# Patient Record
Sex: Male | Born: 1990 | Race: Black or African American | Hispanic: No | Marital: Single | Smoking: Current every day smoker
Health system: Southern US, Community
[De-identification: ages and names within clinical notes are randomized; demographics above are authoritative.]

---

## 2017-09-06 ENCOUNTER — Encounter (HOSPITAL_COMMUNITY): Payer: Self-pay

## 2017-09-06 ENCOUNTER — Emergency Department (HOSPITAL_COMMUNITY): Payer: No Typology Code available for payment source

## 2017-09-06 ENCOUNTER — Other Ambulatory Visit: Payer: Self-pay

## 2017-09-06 ENCOUNTER — Emergency Department (HOSPITAL_COMMUNITY)
Admission: EM | Admit: 2017-09-06 | Discharge: 2017-09-06 | Disposition: A | Discharge status: Home / Self Care | Payer: No Typology Code available for payment source | Attending: Emergency Medicine | Admitting: Emergency Medicine

## 2017-09-06 DIAGNOSIS — Y9389 Activity, other specified: Secondary | ICD-10-CM | POA: Insufficient documentation

## 2017-09-06 DIAGNOSIS — M25522 Pain in left elbow: Secondary | ICD-10-CM

## 2017-09-06 DIAGNOSIS — Y999 Unspecified external cause status: Secondary | ICD-10-CM | POA: Insufficient documentation

## 2017-09-06 DIAGNOSIS — Y92410 Unspecified street and highway as the place of occurrence of the external cause: Secondary | ICD-10-CM | POA: Insufficient documentation

## 2017-09-06 DIAGNOSIS — M542 Cervicalgia: Secondary | ICD-10-CM | POA: Diagnosis not present

## 2017-09-06 MED ORDER — IBUPROFEN 600 MG PO TABS: 600.0000 mg | ORAL_TABLET | Freq: Four times a day (QID) | ORAL | 0 refills | Status: AC | PRN

## 2017-09-06 MED ORDER — METHOCARBAMOL 500 MG PO TABS: 500.0000 mg | ORAL_TABLET | Freq: Two times a day (BID) | ORAL | 0 refills | Status: AC

## 2017-09-06 NOTE — ED Notes (Signed)
Returned from xray

## 2017-09-06 NOTE — ED Notes (Signed)
Patient transported to X-ray 
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
## 2017-09-27 ENCOUNTER — Ambulatory Visit: Payer: Self-pay | Admitting: Family Medicine

## 2018-02-21 ENCOUNTER — Encounter (HOSPITAL_COMMUNITY): Payer: Self-pay | Admitting: Emergency Medicine

## 2018-02-21 ENCOUNTER — Emergency Department (HOSPITAL_COMMUNITY)
Admission: EM | Admit: 2018-02-21 | Discharge: 2018-02-21 | Disposition: A | Discharge status: Home / Self Care | Payer: Self-pay | Attending: Emergency Medicine | Admitting: Emergency Medicine

## 2018-02-21 ENCOUNTER — Other Ambulatory Visit: Payer: Self-pay

## 2018-02-21 DIAGNOSIS — H1033 Unspecified acute conjunctivitis, bilateral: Secondary | ICD-10-CM | POA: Insufficient documentation

## 2018-02-21 DIAGNOSIS — J069 Acute upper respiratory infection, unspecified: Secondary | ICD-10-CM | POA: Insufficient documentation

## 2018-02-21 DIAGNOSIS — Z79899 Other long term (current) drug therapy: Secondary | ICD-10-CM | POA: Insufficient documentation

## 2018-02-21 LAB — GROUP A STREP BY PCR: GROUP A STREP BY PCR: NOT DETECTED

## 2018-02-21 MED ORDER — AMOXICILLIN 500 MG PO CAPS
1000.0000 mg | ORAL_CAPSULE | Freq: Once | ORAL | Status: AC
  Administered 2018-02-21: 1000 mg via ORAL
  Filled 2018-02-21: qty 2

## 2018-02-21 MED ORDER — AMOXICILLIN 500 MG PO CAPS: 1000.0000 mg | ORAL_CAPSULE | Freq: Two times a day (BID) | ORAL | 0 refills | Status: AC

## 2018-02-21 NOTE — ED Triage Notes (Signed)
Pt reports URI sx that started almost a week ago with runny nose, sore throat when swallowing, a productive cough with yellow phlegm also waking up with eyes crusted shut. Pt has taken otc medications with no relief. Pt states he was laying down trying to go to sleep when he felt like he had a loss of breath, reports he needs to prop himself up to sleep at night.

## 2018-02-21 NOTE — Discharge Instructions (Addendum)
Take the oral antibiotics as prescribed - which will also treat the eye infection. Return here with any worsening symptoms - high fever, severe pain or new concern.

## 2018-02-21 NOTE — ED Notes (Signed)
Reviewed d/c instructions with pt, who verbalized understanding and had no outstanding questions. Pt departed in NAD, refused use of wheelchair.   

## 2018-02-21 NOTE — ED Provider Notes (Signed)
[facility] EMERGENCY DEPARTMENT Provider Note   CSN: [id number] Arrival date & time: 02/21/18  0113     History   Chief Complaint Chief Complaint  Patient presents with  . URI    HPI Randy Perkins is a 27 y.o. male.  Patient without significant medical history presents with symptoms of URI including productive cough, nasal congestion, sore throat, sinus pressure and bilateral eye discharge with morning matting. No headache, fever, wheezing, vomiting or diarrhea. He has been eating and drinking per his usual.   The history is provided by the patient. No language interpreter was used.    History reviewed. No pertinent past medical history.  There are no active problems to display for this patient.   History reviewed. No pertinent surgical history.      Home Medications    Prior to Admission medications   Medication Sig Start Date End Date Taking? Authorizing Provider  ibuprofen (ADVIL,MOTRIN) 600 MG tablet Take 1 tablet (600 mg total) by mouth every 6 (six) hours as needed. 09/06/17   Aviva Kluver B, PA-C  methocarbamol (ROBAXIN) 500 MG tablet Take 1 tablet (500 mg total) by mouth 2 (two) times daily. 09/06/17   Elisha Ponder, PA-C    Family History No family history on file.  Social History Social History   Tobacco Use  . Smoking status: Never Smoker  . Smokeless tobacco: Never Used  Substance Use Topics  . Alcohol use: Yes    Comment: social   . Drug use: Not Currently     Allergies   Patient has no known allergies.   Review of Systems Review of Systems  Constitutional: Negative for chills and fever.  HENT: Positive for rhinorrhea and sore throat.   Respiratory: Positive for cough. Negative for shortness of breath.   Cardiovascular: Positive for chest pain.       Chest pain with cough.  Gastrointestinal: Negative.  Negative for nausea and vomiting.  Musculoskeletal: Negative.   Skin: Negative.   Neurological: Negative.        Physical Exam Updated Vital Signs BP 103/66 (BP Location: Left Arm)   Pulse 61   Temp 97.6 F (36.4 C) (Oral)   Resp 12   Ht 5\' 11"  (1.803 m)   Wt 94.8 kg   SpO2 99%   BMI 29.15 kg/m   Physical Exam  Constitutional: He is oriented to person, place, and time. He appears well-developed and well-nourished. No distress.  HENT:  Head: Normocephalic.  Nose: Mucosal edema present.  Mouth/Throat: No oropharyngeal exudate. No tonsillar exudate.  Eyes:  Purulent conjunctival discharge bilaterally   Cardiovascular: Normal rate and regular rhythm.  Pulmonary/Chest: Effort normal. He has no wheezes.  Abdominal: There is no tenderness.  Neurological: He is alert and oriented to person, place, and time.  Nursing note and vitals reviewed.    ED Treatments / Results  Labs (all labs ordered are listed, but only abnormal results are displayed) Labs Reviewed  GROUP A STREP BY PCR    EKG None  Radiology No results found.  Procedures Procedures (including critical care time)  Medications Ordered in ED Medications - No data to display   Initial Impression / Assessment and Plan / ED Course  I have reviewed the triage vital signs and the nursing notes.  Pertinent labs & imaging results that were available during my care of the patient were reviewed by me and considered in my medical decision making (see chart for details).  Patient to ED with one week symptoms of sore throat, eye discharge, cough, congestion. Symptoms are progressive.   He is nontoxic in appearance. Strep negative. Given duration of symptoms and conjunctival discharge that is purulent, will treat with abx. He is appropriate for discharge home.   Final Clinical Impressions(s) / ED Diagnoses   Final diagnoses:  None   1. Conjunctivitis 2. URI  ED Discharge Orders    None       Elpidio AnisUpstill, Tallan Sandoz, PA-C 02/21/18 0510    Shon BatonHorton, Courtney F, MD 02/21/18 [phone]

## 2019-02-09 ENCOUNTER — Encounter (HOSPITAL_COMMUNITY): Payer: Self-pay | Admitting: Emergency Medicine

## 2019-02-09 ENCOUNTER — Inpatient Hospital Stay (HOSPITAL_COMMUNITY): Payer: No Typology Code available for payment source

## 2019-02-09 ENCOUNTER — Emergency Department (HOSPITAL_COMMUNITY): Payer: No Typology Code available for payment source

## 2019-02-09 ENCOUNTER — Inpatient Hospital Stay (HOSPITAL_COMMUNITY)
Admission: EM | Admit: 2019-02-09 | Discharge: 2019-02-12 | DRG: 957 | Disposition: A | Discharge status: Home / Self Care | Payer: No Typology Code available for payment source | Attending: Surgery | Admitting: Surgery

## 2019-02-09 ENCOUNTER — Other Ambulatory Visit: Payer: Self-pay

## 2019-02-09 DIAGNOSIS — D62 Acute posthemorrhagic anemia: Secondary | ICD-10-CM | POA: Diagnosis not present

## 2019-02-09 DIAGNOSIS — F10129 Alcohol abuse with intoxication, unspecified: Secondary | ICD-10-CM | POA: Diagnosis present

## 2019-02-09 DIAGNOSIS — F17210 Nicotine dependence, cigarettes, uncomplicated: Secondary | ICD-10-CM | POA: Diagnosis present

## 2019-02-09 DIAGNOSIS — E876 Hypokalemia: Secondary | ICD-10-CM | POA: Diagnosis present

## 2019-02-09 DIAGNOSIS — S32409A Unspecified fracture of unspecified acetabulum, initial encounter for closed fracture: Secondary | ICD-10-CM

## 2019-02-09 DIAGNOSIS — T796XXA Traumatic ischemia of muscle, initial encounter: Secondary | ICD-10-CM | POA: Diagnosis present

## 2019-02-09 DIAGNOSIS — S32402A Unspecified fracture of left acetabulum, initial encounter for closed fracture: Secondary | ICD-10-CM | POA: Diagnosis present

## 2019-02-09 DIAGNOSIS — E8889 Other specified metabolic disorders: Secondary | ICD-10-CM | POA: Diagnosis present

## 2019-02-09 DIAGNOSIS — S332XXA Dislocation of sacroiliac and sacrococcygeal joint, initial encounter: Secondary | ICD-10-CM | POA: Diagnosis present

## 2019-02-09 DIAGNOSIS — L299 Pruritus, unspecified: Secondary | ICD-10-CM | POA: Diagnosis not present

## 2019-02-09 DIAGNOSIS — S32452A Displaced transverse fracture of left acetabulum, initial encounter for closed fracture: Principal | ICD-10-CM | POA: Diagnosis present

## 2019-02-09 DIAGNOSIS — N5089 Other specified disorders of the male genital organs: Secondary | ICD-10-CM | POA: Diagnosis not present

## 2019-02-09 DIAGNOSIS — M79605 Pain in left leg: Secondary | ICD-10-CM

## 2019-02-09 DIAGNOSIS — Y92410 Unspecified street and highway as the place of occurrence of the external cause: Secondary | ICD-10-CM

## 2019-02-09 DIAGNOSIS — R339 Retention of urine, unspecified: Secondary | ICD-10-CM | POA: Diagnosis not present

## 2019-02-09 DIAGNOSIS — U071 COVID-19: Secondary | ICD-10-CM | POA: Diagnosis present

## 2019-02-09 DIAGNOSIS — J939 Pneumothorax, unspecified: Secondary | ICD-10-CM

## 2019-02-09 DIAGNOSIS — S32810A Multiple fractures of pelvis with stable disruption of pelvic ring, initial encounter for closed fracture: Secondary | ICD-10-CM | POA: Diagnosis present

## 2019-02-09 DIAGNOSIS — E559 Vitamin D deficiency, unspecified: Secondary | ICD-10-CM | POA: Diagnosis present

## 2019-02-09 DIAGNOSIS — S32692A Other specified fracture of left ischium, initial encounter for closed fracture: Secondary | ICD-10-CM

## 2019-02-09 DIAGNOSIS — I10 Essential (primary) hypertension: Secondary | ICD-10-CM | POA: Diagnosis not present

## 2019-02-09 DIAGNOSIS — S32492A Other specified fracture of left acetabulum, initial encounter for closed fracture: Secondary | ICD-10-CM

## 2019-02-09 DIAGNOSIS — S329XXA Fracture of unspecified parts of lumbosacral spine and pelvis, initial encounter for closed fracture: Secondary | ICD-10-CM

## 2019-02-09 LAB — COMPREHENSIVE METABOLIC PANEL
ALT: 30 U/L (ref 0–44)
AST: 32 U/L (ref 15–41)
Albumin: 4.1 g/dL (ref 3.5–5.0)
Alkaline Phosphatase: 38 U/L (ref 38–126)
Anion gap: 14 (ref 5–15)
BUN: 10 mg/dL (ref 6–20)
CO2: 24 mmol/L (ref 22–32)
Calcium: 8.8 mg/dL — ABNORMAL LOW (ref 8.9–10.3)
Chloride: 100 mmol/L (ref 98–111)
Creatinine, Ser: 1.12 mg/dL (ref 0.61–1.24)
GFR calc Af Amer: >60 mL/min (ref >60)
GFR calc non Af Amer: >60 mL/min (ref >60)
Glucose, Bld: 124 mg/dL — ABNORMAL HIGH (ref 70–99)
Potassium: 3.4 mmol/L — ABNORMAL LOW (ref 3.5–5.1)
Sodium: 138 mmol/L (ref 135–145)
Total Bilirubin: 0.4 mg/dL (ref 0.3–1.2)
Total Protein: 7 g/dL (ref 6.5–8.1)

## 2019-02-09 LAB — CBC
HCT: 44.5 % (ref 39.0–52.0)
HCT: 43.2 % (ref 39.0–52.0)
Hemoglobin: 15.4 g/dL (ref 13.0–17.0)
Hemoglobin: 15.2 g/dL (ref 13.0–17.0)
MCH: 30.6 pg (ref 26.0–34.0)
MCH: 30.6 pg (ref 26.0–34.0)
MCHC: 34.6 g/dL (ref 30.0–36.0)
MCHC: 35.2 g/dL (ref 30.0–36.0)
MCV: 88.3 fL (ref 80.0–100.0)
MCV: 87.1 fL (ref 80.0–100.0)
Platelets: 302 10*3/uL (ref 150–400)
Platelets: 266 10*3/uL (ref 150–400)
RBC: 5.04 MIL/uL (ref 4.22–5.81)
RBC: 4.96 MIL/uL (ref 4.22–5.81)
RDW: 12.1 % (ref 11.5–15.5)
RDW: 12.2 % (ref 11.5–15.5)
WBC: 7.9 10*3/uL (ref 4.0–10.5)
WBC: 13 10*3/uL — ABNORMAL HIGH (ref 4.0–10.5)
nRBC: 0.3 % — ABNORMAL HIGH (ref 0.0–0.2)
nRBC: 0 % (ref 0.0–0.2)

## 2019-02-09 LAB — URINALYSIS, ROUTINE W REFLEX MICROSCOPIC
Bilirubin Urine: NEGATIVE
Glucose, UA: NEGATIVE mg/dL
Hgb urine dipstick: NEGATIVE
Ketones, ur: 5 mg/dL — AB
Leukocytes,Ua: NEGATIVE
Nitrite: NEGATIVE
Protein, ur: NEGATIVE mg/dL
Specific Gravity, Urine: >1.046 — ABNORMAL HIGH (ref 1.005–1.030)
pH: 5 (ref 5.0–8.0)

## 2019-02-09 LAB — RAPID URINE DRUG SCREEN, HOSP PERFORMED
Amphetamines: NOT DETECTED
Barbiturates: NOT DETECTED
Benzodiazepines: NOT DETECTED
Cocaine: NOT DETECTED
Opiates: POSITIVE — AB
Tetrahydrocannabinol: NOT DETECTED

## 2019-02-09 LAB — FERRITIN: Ferritin: 191 ng/mL (ref 24–336)

## 2019-02-09 LAB — I-STAT CHEM 8, ED
BUN: 10 mg/dL (ref 6–20)
Calcium, Ion: 1.11 mmol/L — ABNORMAL LOW (ref 1.15–1.40)
Chloride: 101 mmol/L (ref 98–111)
Creatinine, Ser: 1.3 mg/dL — ABNORMAL HIGH (ref 0.61–1.24)
Glucose, Bld: 120 mg/dL — ABNORMAL HIGH (ref 70–99)
HCT: 45 % (ref 39.0–52.0)
Hemoglobin: 15.3 g/dL (ref 13.0–17.0)
Potassium: 3.3 mmol/L — ABNORMAL LOW (ref 3.5–5.1)
Sodium: 139 mmol/L (ref 135–145)
TCO2: 24 mmol/L (ref 22–32)

## 2019-02-09 LAB — D-DIMER, QUANTITATIVE: D-Dimer, Quant: 1.81 ug/mL-FEU — ABNORMAL HIGH (ref 0.00–0.50)

## 2019-02-09 LAB — CREATININE, SERUM
Creatinine, Ser: 1.04 mg/dL (ref 0.61–1.24)
GFR calc Af Amer: >60 mL/min (ref >60)
GFR calc non Af Amer: >60 mL/min (ref >60)

## 2019-02-09 LAB — ABO/RH: ABO/RH(D): O POS

## 2019-02-09 LAB — CDS SEROLOGY

## 2019-02-09 LAB — ETHANOL: Alcohol, Ethyl (B): 212 mg/dL — ABNORMAL HIGH (ref <10)

## 2019-02-09 LAB — SAMPLE TO BLOOD BANK

## 2019-02-09 LAB — HIV ANTIBODY (ROUTINE TESTING W REFLEX): HIV Screen 4th Generation wRfx: NONREACTIVE

## 2019-02-09 LAB — SARS CORONAVIRUS 2 BY RT PCR (HOSPITAL ORDER, PERFORMED IN ~~LOC~~ HOSPITAL LAB): SARS Coronavirus 2: POSITIVE — AB

## 2019-02-09 LAB — PROTIME-INR
INR: 1 (ref 0.8–1.2)
Prothrombin Time: 13.2 seconds (ref 11.4–15.2)

## 2019-02-09 LAB — C-REACTIVE PROTEIN: CRP: 1.2 mg/dL — ABNORMAL HIGH (ref <1.0)

## 2019-02-09 LAB — LACTATE DEHYDROGENASE: LDH: 271 U/L — ABNORMAL HIGH (ref 98–192)

## 2019-02-09 LAB — LACTIC ACID, PLASMA
Lactic Acid, Venous: 3.7 mmol/L (ref 0.5–1.9)
Lactic Acid, Venous: 1.7 mmol/L (ref 0.5–1.9)

## 2019-02-09 LAB — PROCALCITONIN: Procalcitonin: <0.1 ng/mL

## 2019-02-09 LAB — FIBRINOGEN: Fibrinogen: 334 mg/dL (ref 210–475)

## 2019-02-09 LAB — BRAIN NATRIURETIC PEPTIDE: B Natriuretic Peptide: 99.1 pg/mL (ref 0.0–100.0)

## 2019-02-09 LAB — HEPATITIS B SURFACE ANTIGEN: Hepatitis B Surface Ag: NONREACTIVE

## 2019-02-09 MED ORDER — CALCIUM GLUCONATE-NACL 1-0.675 GM/50ML-% IV SOLN
1.0000 g | Freq: Once | INTRAVENOUS | Status: AC
  Administered 2019-02-09: 1000 mg via INTRAVENOUS
  Filled 2019-02-09: qty 50

## 2019-02-09 MED ORDER — HYDROMORPHONE HCL 1 MG/ML IJ SOLN
1.0000 mg | INTRAMUSCULAR | Status: DC | PRN
  Administered 2019-02-09 – 2019-02-10 (×8): 1 mg via INTRAVENOUS
  Filled 2019-02-09 (×7): qty 1

## 2019-02-09 MED ORDER — LIDOCAINE HCL (PF) 1 % IJ SOLN
30.0000 mL | Freq: Once | INTRAMUSCULAR | Status: AC
  Administered 2019-02-09: 30 mL via INTRADERMAL
  Filled 2019-02-09: qty 30

## 2019-02-09 MED ORDER — ONDANSETRON HCL 4 MG/2ML IJ SOLN
INTRAMUSCULAR | Status: AC
  Filled 2019-02-09: qty 2

## 2019-02-09 MED ORDER — ONDANSETRON HCL 4 MG/2ML IJ SOLN: 4.0000 mg | Freq: Four times a day (QID) | INTRAMUSCULAR | Status: DC | PRN

## 2019-02-09 MED ORDER — ENOXAPARIN SODIUM 40 MG/0.4ML ~~LOC~~ SOLN
40.0000 mg | SUBCUTANEOUS | Status: DC
  Administered 2019-02-11 – 2019-02-12 (×2): 40 mg via SUBCUTANEOUS
  Filled 2019-02-09 (×2): qty 0.4

## 2019-02-09 MED ORDER — CEFAZOLIN SODIUM-DEXTROSE 2-4 GM/100ML-% IV SOLN
2.0000 g | INTRAVENOUS | Status: AC
  Administered 2019-02-10: 2 g via INTRAVENOUS
  Filled 2019-02-09: qty 100

## 2019-02-09 MED ORDER — MORPHINE SULFATE 2 MG/ML IJ SOLN
INTRAMUSCULAR | Status: AC | PRN
  Administered 2019-02-09: 4 mg via INTRAVENOUS

## 2019-02-09 MED ORDER — KCL IN DEXTROSE-NACL 20-5-0.9 MEQ/L-%-% IV SOLN
INTRAVENOUS | Status: DC
  Administered 2019-02-09: 20:00:00 via INTRAVENOUS
  Administered 2019-02-09: 100 mL/h via INTRAVENOUS
  Administered 2019-02-10: 19:00:00 via INTRAVENOUS
  Filled 2019-02-09 (×4): qty 1000

## 2019-02-09 MED ORDER — HYDROMORPHONE HCL 1 MG/ML IJ SOLN
INTRAMUSCULAR | Status: AC
  Filled 2019-02-09: qty 1

## 2019-02-09 MED ORDER — VITAMIN C 500 MG PO TABS
500.0000 mg | ORAL_TABLET | Freq: Every day | ORAL | Status: DC
  Administered 2019-02-11 – 2019-02-12 (×2): 500 mg via ORAL
  Filled 2019-02-09 (×2): qty 1

## 2019-02-09 MED ORDER — PANTOPRAZOLE SODIUM 40 MG PO TBEC
40.0000 mg | DELAYED_RELEASE_TABLET | Freq: Every day | ORAL | Status: DC
  Administered 2019-02-11: 40 mg via ORAL
  Filled 2019-02-09: qty 1

## 2019-02-09 MED ORDER — ALBUTEROL SULFATE HFA 108 (90 BASE) MCG/ACT IN AERS
2.0000 | INHALATION_SPRAY | Freq: Four times a day (QID) | RESPIRATORY_TRACT | Status: DC | PRN
  Filled 2019-02-09: qty 6.7

## 2019-02-09 MED ORDER — HYDROMORPHONE HCL 1 MG/ML IJ SOLN
INTRAMUSCULAR | Status: AC | PRN
  Administered 2019-02-09: 1 mg via INTRAVENOUS

## 2019-02-09 MED ORDER — MORPHINE SULFATE (PF) 4 MG/ML IV SOLN
INTRAVENOUS | Status: AC
  Filled 2019-02-09: qty 1

## 2019-02-09 MED ORDER — ZINC SULFATE 220 (50 ZN) MG PO CAPS
220.0000 mg | ORAL_CAPSULE | Freq: Every day | ORAL | Status: DC
  Administered 2019-02-11 – 2019-02-12 (×2): 220 mg via ORAL
  Filled 2019-02-09 (×3): qty 1

## 2019-02-09 MED ORDER — ONDANSETRON HCL 4 MG/2ML IJ SOLN
INTRAMUSCULAR | Status: AC | PRN
  Administered 2019-02-09: 4 mg via INTRAVENOUS

## 2019-02-09 MED ORDER — ONDANSETRON 4 MG PO TBDP: 4.0000 mg | ORAL_TABLET | Freq: Four times a day (QID) | ORAL | Status: DC | PRN

## 2019-02-09 MED ORDER — IOHEXOL 300 MG/ML  SOLN
100.0000 mL | Freq: Once | INTRAMUSCULAR | Status: AC | PRN
  Administered 2019-02-09: 100 mL via INTRAVENOUS

## 2019-02-09 MED ORDER — LIDOCAINE HCL 1 % IJ SOLN: 20.0000 mL | INTRAMUSCULAR | Status: DC

## 2019-02-09 MED ORDER — GUAIFENESIN-DM 100-10 MG/5ML PO SYRP: 10.0000 mL | ORAL_SOLUTION | ORAL | Status: DC | PRN
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
## 2019-02-09 NOTE — Progress Notes (Signed)
Orthopedic Tech Progress Note Patient Details:  Randy Perkins [birth year] [id number]  Musculoskeletal Traction Type of Traction: Bucks Skin Traction Traction Location: lle Traction Weight: 10 lbs   Post Interventions Patient Tolerated: Well Instructions Provided: Care of device, Adjustment of device   Karolee Stamps 02/09/2019, 6:19 AM

## 2019-02-09 NOTE — ED Notes (Signed)
Pt resting quietly at this time  Call light at bedside
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
## 2019-02-09 NOTE — ED Notes (Signed)
Port pelvis x-ray obtained

## 2019-02-09 NOTE — ED Notes (Signed)
Dinner tray ordered for pt

## 2019-02-09 NOTE — Procedures (Signed)
Clinicians: Altamese Lapwai, MD        Ainsley Spinner, PA-C  Procedure: Closed reduction left acetabulum and left pelvic ring, placement of left proximal tibia skeletal traction pin  Medications: 20 cc of 1% lidocaine without epinephrine (10 cc medially and 10 cc laterally)  Details:  Injury and treatment were reviewed with the patient.  skeletal traction placement necessary to further limit the injury to his joint surfaces on both his femoral head as well as acetabulum and improve hemodynamics. It would also provide him some stability while we await definitive fixation. Patient is agreeable to proceed with traction pin placement.  Clinical exam was completed.  An area approximately on thumbs breadth distal and posterior to the tibial tuberosity was identified laterally for starting point of the k-wire.  Soft tissue was infiltrated with lidocaine as noted above.   A 2.0 mm K wire was selected. After adequate anesthesia of the soft tissues the K wire was introduced through the soft tissue laterally along the proximal tibia. The pin was inserted and placed down to the bone. Once I was comfortable with the starting point the pin was advanced utilizing power drill through the lateral proximal tibia and out the medial proximal tibia. Trying to remain parallel to the joint. Pt tolerated the procedure well.    After the pin was advanced through and equal lengths of the pin were noted medially and laterally the tension bow was applied and the ends of the wire were bent up and covered with tape. 30 pounds of weight were added. The leg was set up in an abducted position along with blankets underneath the knee and thigh. Patient had relief of his hip pain with application of traction  Again patient tolerated the procedure extremely well. No complications were noted. Symmetric pulses were noted post procedure. Motor and sensory exam was intact and unchanged from baseline. Plan for OR tomorrow for ORIF left acetabulum and  left pelvic ring  Of note patient was placed on hospital bed equipped for traction prior to starting the procedure   Jari Pigg, PA-C [phone] (C) 02/09/2019, 7:05 PM  [facility] [place] [address] [phone] Domingo Sep (F)

## 2019-02-09 NOTE — ED Notes (Signed)
Ordered a orthopedic hospital bed per rn Yasemia--Tishie Altmann

## 2019-02-09 NOTE — ED Notes (Signed)
Condom cath removed at pt's request
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
## 2019-02-09 NOTE — ED Notes (Signed)
1934.00 locked up in security envelope #[id number] [phone]
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
## 2019-02-09 NOTE — ED Notes (Signed)
Ortho at bedside.

## 2019-02-09 NOTE — ED Notes (Signed)
Pt wife name Andria Frames would like an update on the pt status. Please call [phone].

## 2019-02-09 NOTE — ED Notes (Signed)
Patient wife Randy Perkins called and update given.
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
## 2019-02-10 ENCOUNTER — Inpatient Hospital Stay (HOSPITAL_COMMUNITY): Payer: No Typology Code available for payment source | Admitting: Certified Registered Nurse Anesthetist

## 2019-02-10 ENCOUNTER — Encounter (HOSPITAL_COMMUNITY): Admission: EM | Disposition: A | Discharge status: Home / Self Care | Payer: Self-pay | Source: Home / Self Care

## 2019-02-10 ENCOUNTER — Inpatient Hospital Stay (HOSPITAL_COMMUNITY): Payer: No Typology Code available for payment source

## 2019-02-10 DIAGNOSIS — S32402A Unspecified fracture of left acetabulum, initial encounter for closed fracture: Secondary | ICD-10-CM

## 2019-02-10 HISTORY — PX: ORIF ACETABULAR FRACTURE: SHX5029

## 2019-02-10 LAB — COMPREHENSIVE METABOLIC PANEL
ALT: 30 U/L (ref 0–44)
AST: 41 U/L (ref 15–41)
Albumin: 4 g/dL (ref 3.5–5.0)
Alkaline Phosphatase: 40 U/L (ref 38–126)
Anion gap: 14 (ref 5–15)
BUN: 13 mg/dL (ref 6–20)
CO2: 26 mmol/L (ref 22–32)
Calcium: 9.4 mg/dL (ref 8.9–10.3)
Chloride: 98 mmol/L (ref 98–111)
Creatinine, Ser: 1.21 mg/dL (ref 0.61–1.24)
GFR calc Af Amer: >60 mL/min (ref >60)
GFR calc non Af Amer: >60 mL/min (ref >60)
Glucose, Bld: 129 mg/dL — ABNORMAL HIGH (ref 70–99)
Potassium: 3.5 mmol/L (ref 3.5–5.1)
Sodium: 138 mmol/L (ref 135–145)
Total Bilirubin: 1.4 mg/dL — ABNORMAL HIGH (ref 0.3–1.2)
Total Protein: 7.3 g/dL (ref 6.5–8.1)

## 2019-02-10 LAB — CBC WITH DIFFERENTIAL/PLATELET
Abs Immature Granulocytes: 0.04 10*3/uL (ref 0.00–0.07)
Basophils Absolute: 0 10*3/uL (ref 0.0–0.1)
Basophils Relative: 0 %
Eosinophils Absolute: 0 10*3/uL (ref 0.0–0.5)
Eosinophils Relative: 0 %
HCT: 45.6 % (ref 39.0–52.0)
Hemoglobin: 15.5 g/dL (ref 13.0–17.0)
Immature Granulocytes: 1 %
Lymphocytes Relative: 21 %
Lymphs Abs: 1.8 10*3/uL (ref 0.7–4.0)
MCH: 30.6 pg (ref 26.0–34.0)
MCHC: 34 g/dL (ref 30.0–36.0)
MCV: 89.9 fL (ref 80.0–100.0)
Monocytes Absolute: 1 10*3/uL (ref 0.1–1.0)
Monocytes Relative: 12 %
Neutro Abs: 5.5 10*3/uL (ref 1.7–7.7)
Neutrophils Relative %: 66 %
Platelets: 277 10*3/uL (ref 150–400)
RBC: 5.07 MIL/uL (ref 4.22–5.81)
RDW: 12.5 % (ref 11.5–15.5)
WBC: 8.4 10*3/uL (ref 4.0–10.5)
nRBC: 0 % (ref 0.0–0.2)

## 2019-02-10 LAB — POCT I-STAT, CHEM 8
BUN: 13 mg/dL (ref 6–20)
BUN: 12 mg/dL (ref 6–20)
Calcium, Ion: 1.2 mmol/L (ref 1.15–1.40)
Calcium, Ion: 1.17 mmol/L (ref 1.15–1.40)
Chloride: 100 mmol/L (ref 98–111)
Chloride: 99 mmol/L (ref 98–111)
Creatinine, Ser: 1 mg/dL (ref 0.61–1.24)
Creatinine, Ser: 0.9 mg/dL (ref 0.61–1.24)
Glucose, Bld: 103 mg/dL — ABNORMAL HIGH (ref 70–99)
Glucose, Bld: 126 mg/dL — ABNORMAL HIGH (ref 70–99)
HCT: 39 % (ref 39.0–52.0)
HCT: 35 % — ABNORMAL LOW (ref 39.0–52.0)
Hemoglobin: 13.3 g/dL (ref 13.0–17.0)
Hemoglobin: 11.9 g/dL — ABNORMAL LOW (ref 13.0–17.0)
Potassium: 4 mmol/L (ref 3.5–5.1)
Potassium: 4.1 mmol/L (ref 3.5–5.1)
Sodium: 137 mmol/L (ref 135–145)
Sodium: 137 mmol/L (ref 135–145)
TCO2: 26 mmol/L (ref 22–32)
TCO2: 24 mmol/L (ref 22–32)

## 2019-02-10 LAB — VITAMIN D 25 HYDROXY (VIT D DEFICIENCY, FRACTURES): Vit D, 25-Hydroxy: 10.58 ng/mL — ABNORMAL LOW (ref 30–100)

## 2019-02-10 LAB — C-REACTIVE PROTEIN: CRP: 10.4 mg/dL — ABNORMAL HIGH (ref <1.0)

## 2019-02-10 LAB — PHOSPHORUS: Phosphorus: 3.3 mg/dL (ref 2.5–4.6)

## 2019-02-10 LAB — MAGNESIUM: Magnesium: 1.9 mg/dL (ref 1.7–2.4)

## 2019-02-10 LAB — FERRITIN: Ferritin: 202 ng/mL (ref 24–336)

## 2019-02-10 LAB — D-DIMER, QUANTITATIVE: D-Dimer, Quant: 1.33 ug/mL-FEU — ABNORMAL HIGH (ref 0.00–0.50)

## 2019-02-10 SURGERY — OPEN REDUCTION INTERNAL FIXATION (ORIF) ACETABULAR FRACTURE
Anesthesia: General | Site: Pelvis | Laterality: Left

## 2019-02-10 MED ORDER — OXYCODONE HCL 5 MG PO TABS
10.0000 mg | ORAL_TABLET | ORAL | Status: DC | PRN
  Administered 2019-02-10 – 2019-02-11 (×5): 15 mg via ORAL
  Administered 2019-02-12 (×3): 10 mg via ORAL
  Filled 2019-02-10 (×4): qty 3
  Filled 2019-02-10 (×2): qty 2
  Filled 2019-02-10: qty 3
  Filled 2019-02-10: qty 2

## 2019-02-10 MED ORDER — ACETAMINOPHEN 10 MG/ML IV SOLN
INTRAVENOUS | Status: DC | PRN
  Administered 2019-02-10: 1000 mg via INTRAVENOUS

## 2019-02-10 MED ORDER — FENTANYL CITRATE (PF) 100 MCG/2ML IJ SOLN: 25.0000 ug | INTRAMUSCULAR | Status: DC | PRN

## 2019-02-10 MED ORDER — FENTANYL CITRATE (PF) 100 MCG/2ML IJ SOLN
25.0000 ug | INTRAMUSCULAR | Status: DC | PRN
  Administered 2019-02-10 (×2): 50 ug via INTRAVENOUS

## 2019-02-10 MED ORDER — MIDAZOLAM HCL 5 MG/5ML IJ SOLN
INTRAMUSCULAR | Status: DC | PRN
  Administered 2019-02-10: 2 mg via INTRAVENOUS

## 2019-02-10 MED ORDER — 0.9 % SODIUM CHLORIDE (POUR BTL) OPTIME
TOPICAL | Status: DC | PRN
  Administered 2019-02-10: 1000 mL

## 2019-02-10 MED ORDER — ONDANSETRON HCL 4 MG/2ML IJ SOLN: 4.0000 mg | Freq: Once | INTRAMUSCULAR | Status: DC | PRN

## 2019-02-10 MED ORDER — OXYCODONE HCL 5 MG PO TABS
5.0000 mg | ORAL_TABLET | Freq: Once | ORAL | Status: AC | PRN
  Administered 2019-02-10: 5 mg via ORAL

## 2019-02-10 MED ORDER — HYDROMORPHONE HCL 1 MG/ML IJ SOLN
INTRAMUSCULAR | Status: DC | PRN
  Administered 2019-02-10: 1 mg via INTRAVENOUS

## 2019-02-10 MED ORDER — PROPOFOL 10 MG/ML IV BOLUS
INTRAVENOUS | Status: DC | PRN
  Administered 2019-02-10: 200 mg via INTRAVENOUS

## 2019-02-10 MED ORDER — CHLORHEXIDINE GLUCONATE CLOTH 2 % EX PADS
6.0000 | MEDICATED_PAD | Freq: Every day | CUTANEOUS | Status: DC
  Administered 2019-02-10 – 2019-02-12 (×3): 6 via TOPICAL

## 2019-02-10 MED ORDER — ONDANSETRON HCL 4 MG/2ML IJ SOLN
INTRAMUSCULAR | Status: DC | PRN
  Administered 2019-02-10: 4 mg via INTRAVENOUS

## 2019-02-10 MED ORDER — SUGAMMADEX SODIUM 200 MG/2ML IV SOLN
INTRAVENOUS | Status: DC | PRN
  Administered 2019-02-10: 400 mg via INTRAVENOUS

## 2019-02-10 MED ORDER — PROPOFOL 10 MG/ML IV BOLUS
INTRAVENOUS | Status: AC
  Filled 2019-02-10: qty 40

## 2019-02-10 MED ORDER — LIDOCAINE 2% (20 MG/ML) 5 ML SYRINGE
INTRAMUSCULAR | Status: DC | PRN
  Administered 2019-02-10: 60 mg via INTRAVENOUS

## 2019-02-10 MED ORDER — ALBUMIN HUMAN 5 % IV SOLN
INTRAVENOUS | Status: DC | PRN
  Administered 2019-02-10: 09:00:00 via INTRAVENOUS

## 2019-02-10 MED ORDER — SUCCINYLCHOLINE CHLORIDE 20 MG/ML IJ SOLN
INTRAMUSCULAR | Status: DC | PRN
  Administered 2019-02-10: 120 mg via INTRAVENOUS

## 2019-02-10 MED ORDER — METHOCARBAMOL 750 MG PO TABS
750.0000 mg | ORAL_TABLET | Freq: Three times a day (TID) | ORAL | Status: DC
  Administered 2019-02-10 – 2019-02-11 (×2): 750 mg via ORAL
  Filled 2019-02-10 (×6): qty 1

## 2019-02-10 MED ORDER — OXYCODONE HCL 5 MG/5ML PO SOLN: 5.0000 mg | Freq: Once | ORAL | Status: AC | PRN

## 2019-02-10 MED ORDER — FENTANYL CITRATE (PF) 250 MCG/5ML IJ SOLN
INTRAMUSCULAR | Status: AC
  Filled 2019-02-10: qty 5

## 2019-02-10 MED ORDER — SODIUM CHLORIDE 0.9 % IR SOLN
Status: DC | PRN
  Administered 2019-02-10: 3000 mL

## 2019-02-10 MED ORDER — HEMOSTATIC AGENTS (NO CHARGE) OPTIME
TOPICAL | Status: DC | PRN
  Administered 2019-02-10: 1 via TOPICAL

## 2019-02-10 MED ORDER — OXYCODONE HCL 5 MG/5ML PO SOLN: 5.0000 mg | Freq: Once | ORAL | Status: DC | PRN

## 2019-02-10 MED ORDER — HYDROMORPHONE HCL 1 MG/ML IJ SOLN
INTRAMUSCULAR | Status: AC
  Filled 2019-02-10: qty 0.5

## 2019-02-10 MED ORDER — FENTANYL CITRATE (PF) 100 MCG/2ML IJ SOLN
INTRAMUSCULAR | Status: DC | PRN
  Administered 2019-02-10: 50 ug via INTRAVENOUS
  Administered 2019-02-10: 150 ug via INTRAVENOUS
  Administered 2019-02-10 (×6): 50 ug via INTRAVENOUS

## 2019-02-10 MED ORDER — ACETAMINOPHEN 500 MG PO TABS
1000.0000 mg | ORAL_TABLET | Freq: Three times a day (TID) | ORAL | Status: DC
  Administered 2019-02-10 – 2019-02-11 (×3): 1000 mg via ORAL
  Filled 2019-02-10 (×3): qty 2

## 2019-02-10 MED ORDER — VITAMIN D 25 MCG (1000 UNIT) PO TABS
2000.0000 [IU] | ORAL_TABLET | Freq: Two times a day (BID) | ORAL | Status: DC
  Administered 2019-02-11 – 2019-02-12 (×3): 2000 [IU] via ORAL
  Filled 2019-02-10 (×4): qty 2

## 2019-02-10 MED ORDER — MIDAZOLAM HCL 2 MG/2ML IJ SOLN
INTRAMUSCULAR | Status: AC
  Filled 2019-02-10: qty 2

## 2019-02-10 MED ORDER — ROCURONIUM BROMIDE 50 MG/5ML IV SOSY
PREFILLED_SYRINGE | INTRAVENOUS | Status: DC | PRN
  Administered 2019-02-10: 30 mg via INTRAVENOUS
  Administered 2019-02-10: 70 mg via INTRAVENOUS
  Administered 2019-02-10: 20 mg via INTRAVENOUS

## 2019-02-10 MED ORDER — FENTANYL CITRATE (PF) 100 MCG/2ML IJ SOLN
INTRAMUSCULAR | Status: AC
  Filled 2019-02-10: qty 2

## 2019-02-10 MED ORDER — OXYCODONE HCL 5 MG PO TABS
ORAL_TABLET | ORAL | Status: AC
  Filled 2019-02-10: qty 1

## 2019-02-10 MED ORDER — LACTATED RINGERS IV SOLN
INTRAVENOUS | Status: DC | PRN
  Administered 2019-02-10 (×3): via INTRAVENOUS

## 2019-02-10 MED ORDER — CEFAZOLIN SODIUM-DEXTROSE 1-4 GM/50ML-% IV SOLN
1.0000 g | Freq: Three times a day (TID) | INTRAVENOUS | Status: AC
  Administered 2019-02-10 – 2019-02-11 (×3): 1 g via INTRAVENOUS
  Filled 2019-02-10 (×3): qty 50

## 2019-02-10 MED ORDER — DEXAMETHASONE SODIUM PHOSPHATE 10 MG/ML IJ SOLN
INTRAMUSCULAR | Status: DC | PRN
  Administered 2019-02-10: 10 mg via INTRAVENOUS

## 2019-02-10 MED ORDER — CEFAZOLIN SODIUM-DEXTROSE 2-3 GM-%(50ML) IV SOLR
INTRAVENOUS | Status: DC | PRN
  Administered 2019-02-10: 2 g via INTRAVENOUS

## 2019-02-10 MED ORDER — OXYCODONE HCL 5 MG PO TABS: 5.0000 mg | ORAL_TABLET | Freq: Once | ORAL | Status: DC | PRN

## 2019-02-10 SURGICAL SUPPLY — 86 items
APPLIER CLIP 11 MED OPEN (CLIP) ×3
BIT DRILL 5.6 (BIT) ×1 IMPLANT
BIT DRILL AO MATTA 2.5MX230M (BIT) ×1 IMPLANT
BIT DRILL STEP 3.5 (DRILL) IMPLANT
BIT DRILL TWST MATTA 3.5MX195M (BIT) ×1 IMPLANT
BLADE CLIPPER SURG (BLADE) IMPLANT
BRUSH SCRUB EZ PLAIN DRY (MISCELLANEOUS) ×6 IMPLANT
CLIP APPLIE 11 MED OPEN (CLIP) ×1 IMPLANT
CLOSURE WOUND 1/2 X4 (GAUZE/BANDAGES/DRESSINGS) ×1
COVER SURGICAL LIGHT HANDLE (MISCELLANEOUS) ×3 IMPLANT
COVER WAND RF STERILE (DRAPES) ×3 IMPLANT
DRAPE C-ARM 42X72 X-RAY (DRAPES) ×3 IMPLANT
DRAPE C-ARMOR (DRAPES) ×3 IMPLANT
DRAPE INCISE IOBAN 66X45 STRL (DRAPES) ×3 IMPLANT
DRAPE INCISE IOBAN 85X60 (DRAPES) ×3 IMPLANT
DRAPE ORTHO SPLIT 77X108 STRL (DRAPES) ×4
DRAPE SURG ORHT 6 SPLT 77X108 (DRAPES) ×2 IMPLANT
DRAPE U-SHAPE 47X51 STRL (DRAPES) ×3 IMPLANT
DRESSING AQUACEL AG SP 3.5X10 (GAUZE/BANDAGES/DRESSINGS) ×1 IMPLANT
DRESSING AQUACEL AG SP 3.5X4 (GAUZE/BANDAGES/DRESSINGS) ×1 IMPLANT
DRILL 5.6 (BIT) ×3
DRILL BIT AO MATTA 2.5MX230M (BIT) ×3
DRILL STEP 3.5 (DRILL)
DRILL TWIST AO MATTA 3.5MX195M (BIT) ×3
DRSG AQUACEL AG SP 3.5X10 (GAUZE/BANDAGES/DRESSINGS) ×3
DRSG AQUACEL AG SP 3.5X4 (GAUZE/BANDAGES/DRESSINGS) ×3
DRSG MEPILEX BORDER 4X12 (GAUZE/BANDAGES/DRESSINGS) IMPLANT
DRSG MEPILEX BORDER 4X4 (GAUZE/BANDAGES/DRESSINGS) ×6 IMPLANT
DRSG MEPILEX BORDER 4X8 (GAUZE/BANDAGES/DRESSINGS) IMPLANT
ELECT BLADE 6.5 EXT (BLADE) ×3 IMPLANT
ELECT REM PT RETURN 9FT ADLT (ELECTROSURGICAL) ×3
ELECTRODE REM PT RTRN 9FT ADLT (ELECTROSURGICAL) ×1 IMPLANT
GLOVE BIO SURGEON STRL SZ7.5 (GLOVE) ×9 IMPLANT
GLOVE BIO SURGEON STRL SZ8 (GLOVE) ×9 IMPLANT
GLOVE BIOGEL PI IND STRL 7.5 (GLOVE) ×1 IMPLANT
GLOVE BIOGEL PI IND STRL 8 (GLOVE) ×1 IMPLANT
GLOVE BIOGEL PI INDICATOR 7.5 (GLOVE) ×2
GLOVE BIOGEL PI INDICATOR 8 (GLOVE) ×2
GOWN STRL REUS W/ TWL LRG LVL3 (GOWN DISPOSABLE) ×2 IMPLANT
GOWN STRL REUS W/ TWL XL LVL3 (GOWN DISPOSABLE) ×2 IMPLANT
GOWN STRL REUS W/TWL LRG LVL3 (GOWN DISPOSABLE) ×4
GOWN STRL REUS W/TWL XL LVL3 (GOWN DISPOSABLE) ×4
GUIDEWIRE ASNIS 3.2 NONCAL (WIRE) ×3 IMPLANT
HANDPIECE INTERPULSE COAX TIP (DISPOSABLE) ×2
HEMOSTAT ARISTA ABSORB 3G PWDR (HEMOSTASIS) ×3 IMPLANT
KIT BASIN OR (CUSTOM PROCEDURE TRAY) ×3 IMPLANT
KIT TURNOVER KIT B (KITS) ×3 IMPLANT
MANIFOLD NEPTUNE II (INSTRUMENTS) ×3 IMPLANT
NEEDLE MAYO TROCAR (NEEDLE) ×3 IMPLANT
NS IRRIG 1000ML POUR BTL (IV SOLUTION) ×3 IMPLANT
PACK TOTAL JOINT (CUSTOM PROCEDURE TRAY) ×3 IMPLANT
PAD ARMBOARD 7.5X6 YLW CONV (MISCELLANEOUS) ×6 IMPLANT
PLATE SUPRAPECTINEAL L (Plate) ×3 IMPLANT
RETRACTOR YANK SUCT EIGR SABER (INSTRUMENTS) ×3 IMPLANT
RETRIEVER SUT HEWSON (MISCELLANEOUS) ×3 IMPLANT
SCREW CANN BONE 8.0X165MM (Screw) ×3 IMPLANT
SCREW CORT 48X3.5XST NS LF (Screw) ×1 IMPLANT
SCREW CORTEX ST MATTA 3.5X24 (Screw) ×6 IMPLANT
SCREW CORTEX ST MATTA 3.5X26MM (Screw) ×9 IMPLANT
SCREW CORTEX ST MATTA 3.5X30MM (Screw) ×3 IMPLANT
SCREW CORTEX ST MATTA 3.5X38M (Screw) ×6 IMPLANT
SCREW CORTICAL 3.5X48MM (Screw) ×2 IMPLANT
SET HNDPC FAN SPRY TIP SCT (DISPOSABLE) ×1 IMPLANT
SPONGE LAP 18X18 RF (DISPOSABLE) IMPLANT
STAPLER VISISTAT 35W (STAPLE) ×3 IMPLANT
STRIP CLOSURE SKIN 1/2X4 (GAUZE/BANDAGES/DRESSINGS) ×2 IMPLANT
SUCTION FRAZIER HANDLE 10FR (MISCELLANEOUS) ×2
SUCTION TUBE FRAZIER 10FR DISP (MISCELLANEOUS) ×1 IMPLANT
SUT ETHILON 2 0 PSLX (SUTURE) ×6 IMPLANT
SUT FIBERWIRE #2 38 T-5 BLUE (SUTURE) ×6
SUT VIC AB 0 CT1 27 (SUTURE) ×4
SUT VIC AB 0 CT1 27XBRD ANBCTR (SUTURE) ×2 IMPLANT
SUT VIC AB 1 CT1 18XCR BRD 8 (SUTURE) ×1 IMPLANT
SUT VIC AB 1 CT1 27 (SUTURE) ×4
SUT VIC AB 1 CT1 27XBRD ANBCTR (SUTURE) ×2 IMPLANT
SUT VIC AB 1 CT1 8-18 (SUTURE) ×2
SUT VIC AB 2-0 CT1 27 (SUTURE) ×4
SUT VIC AB 2-0 CT1 TAPERPNT 27 (SUTURE) ×2 IMPLANT
SUTURE FIBERWR #2 38 T-5 BLUE (SUTURE) ×2 IMPLANT
TOWEL GREEN STERILE (TOWEL DISPOSABLE) ×6 IMPLANT
TOWEL GREEN STERILE FF (TOWEL DISPOSABLE) ×6 IMPLANT
TRAY FOLEY MTR SLVR 16FR STAT (SET/KITS/TRAYS/PACK) IMPLANT
TUBE CONNECTING 20'X1/4 (TUBING) ×1
TUBE CONNECTING 20X1/4 (TUBING) ×2 IMPLANT
WASHER SCREW MATTA SS 13.0X1.5 (Washer) ×3 IMPLANT
WATER STERILE IRR 1000ML POUR (IV SOLUTION) IMPLANT

## 2019-02-10 NOTE — ED Notes (Signed)
Pt asking for dilaudid  He reports that he gets it every 2 hours

## 2019-02-10 NOTE — Anesthesia Procedure Notes (Signed)
Procedure Name: Intubation Date/Time: 02/10/2019 8:49 AM Performed by: Neldon Newport, CRNA Pre-anesthesia Checklist: Timeout performed, Patient being monitored, Suction available, Emergency Drugs available and Patient identified Patient Re-evaluated:Patient Re-evaluated prior to induction Oxygen Delivery Method: Circle system utilized Preoxygenation: Pre-oxygenation with 100% oxygen Induction Type: IV induction and Rapid sequence Laryngoscope Size: Mac and 4 Grade View: Grade I Tube type: Oral Number of attempts: 1 Placement Confirmation: breath sounds checked- equal and bilateral,  ETT inserted through vocal cords under direct vision and positive ETCO2 Secured at: 23 cm Tube secured with: Tape Dental Injury: Teeth and Oropharynx as per pre-operative assessment

## 2019-02-10 NOTE — ED Notes (Signed)
The pt has slept little all night  He calls for pain med on the 2 hour mark.  He thinks he was told he would be having surgery on his leg  No orders for pre-op  Pt was told.  Pt lying with his penis exposed playing with himself

## 2019-02-10 NOTE — Anesthesia Preprocedure Evaluation (Signed)
Anesthesia Evaluation  Patient identified by MRN, date of birth, ID band Patient awake    Reviewed: Allergy & Precautions, NPO status , Patient's Chart, lab work & pertinent test results  Airway Mallampati: II  TM Distance: >3 FB Neck ROM: Full    Dental  (+) Teeth Intact, Dental Advisory Given   Pulmonary Current Smoker,    breath sounds clear to auscultation       Cardiovascular  Rhythm:Regular Rate:Normal     Neuro/Psych    GI/Hepatic   Endo/Other    Renal/GU      Musculoskeletal   Abdominal   Peds  Hematology   Anesthesia Other Findings   Reproductive/Obstetrics                             Anesthesia Physical Anesthesia Plan  ASA: II  Anesthesia Plan: General   Post-op Pain Management:    Induction: Intravenous  PONV Risk Score and Plan: Ondansetron and Dexamethasone  Airway Management Planned: Oral ETT  Additional Equipment: CVP  Intra-op Plan:   Post-operative Plan: Extubation in OR  Informed Consent: I have reviewed the patients History and Physical, chart, labs and discussed the procedure including the risks, benefits and alternatives for the proposed anesthesia with the patient or authorized representative who has indicated his/her understanding and acceptance.     Dental advisory given  Plan Discussed with: CRNA and Anesthesiologist  Anesthesia Plan Comments:         Anesthesia Quick Evaluation

## 2019-02-10 NOTE — ED Notes (Signed)
The ortho tech has just been by  And checked the traction on the pt she reports that it appears to be fine
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
## 2019-02-10 NOTE — Anesthesia Postprocedure Evaluation (Signed)
Anesthesia Post Note  Patient: Randy Perkins  Procedure(s) Performed: OPEN REDUCTION INTERNAL FIXATION (ORIF) ACETABULAR FRACTURE, LEFT SI SCREW (Left Pelvis)     Patient location during evaluation: PACU Anesthesia Type: General Level of consciousness: awake and alert Pain management: pain level controlled Vital Signs Assessment: post-procedure vital signs reviewed and stable Respiratory status: spontaneous breathing, nonlabored ventilation, respiratory function stable and patient connected to nasal cannula oxygen Cardiovascular status: blood pressure returned to baseline and stable Postop Assessment: no apparent nausea or vomiting Anesthetic complications: no    Last Vitals:  Vitals:   02/10/19 1300 02/10/19 1315  BP: (!) 162/88 (!) 149/96  Pulse: (!) 108 (!) 128  Resp: 17 12  Temp:    SpO2: 93% 92%    Last Pain:  Vitals:   02/10/19 1315  TempSrc:   PainSc: 6                  Britt Petroni COKER
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
## 2019-02-10 NOTE — Plan of Care (Signed)

## 2019-02-10 NOTE — Progress Notes (Addendum)
Orthopaedic Trauma Service Progress Note  Contrary to ED Note placed at 0500 this am Pre-op orders were placed yesterday evening Individual orders such as consent, IV abx, requisite labs (CBC, T&S, BMET, lactic acid), NPO, etc were all placed as individual orders. There was no need to use a separate order set and place superfluous orders  Consult note from yesterday also indicated surgery would take place today as well Pts surgery was posted yesterday afternoon as well   Pt has already received his pre-op abx at 0600 Will likely redose pre-op or at the very least will require additional dose in OR   H/H and lytes look good Lactic acid normalized   Surgery this am as planned since yesterday   Jari Pigg, PA-C [phone] (C) 02/10/2019, 8:02 AM  [facility] [place] [phone] Jenetta [phone] (F)   After 6pm on weekdays please call office number to get in touch with on call provider or refer to [place] and look to see who is on call for the Sports Medicine Call Group which is listed under orthopaedics   On Weekends please call office number to get in touch with on call provider or refer to [place] and look to see who is on call for the Sports Medicine Call Group which is listed under orthopaedics
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
## 2019-02-10 NOTE — ED Notes (Signed)
The pt has been given 1 mg of dilaudid iv every 2 hours all night

## 2019-02-10 NOTE — Anesthesia Procedure Notes (Addendum)
Central Venous Catheter Insertion Performed by: Roberts Gaudy, MD, anesthesiologist Start/End11/29/2020 8:50 AM, 02/10/2019 9:00 AM Patient location: Pre-op. Preanesthetic checklist: patient identified, IV checked, site marked, risks and benefits discussed, surgical consent, monitors and equipment checked, pre-op evaluation, timeout performed and anesthesia consent Position: Trendelenburg Lidocaine 1% used for infiltration and patient sedated Hand hygiene performed  and maximum sterile barriers used  Catheter size: 8 Fr Total catheter length 16. Central line was placed.Double lumen Procedure performed using ultrasound guided technique. Ultrasound Notes:image(s) printed for medical record Attempts: 1 Following insertion, dressing applied and line sutured. Post procedure assessment: blood return through all ports  Patient tolerated the procedure well with no immediate complications.

## 2019-02-10 NOTE — ED Notes (Signed)
[phone] pts father Roderic Palau would like an update on pt

## 2019-02-10 NOTE — Progress Notes (Signed)
Orthopedic Tech Progress Note Patient Details:  Randy Perkins 07/08/1990 [id number]  Patient ID: Randy Perkins, male   DOB: Feb 08, 1991, 28 y.o.   MRN: [id number]   Randy Perkins 02/10/2019, 8:10 AMChecked skeletal traction.

## 2019-02-10 NOTE — ED Notes (Signed)
Pt c/omuch pain in his  Lt leg  I believe the traction is not aligned  Ortho tech called to check the traction splint.  The pt requesting medicine other than the dilaudid I just gave him    He wants the medicine pushed in fast  Not done  Given as usual over 3-5 minutes

## 2019-02-10 NOTE — Transfer of Care (Signed)
Immediate Anesthesia Transfer of Care Note  Patient: Randy Perkins  Procedure(s) Performed: OPEN REDUCTION INTERNAL FIXATION (ORIF) ACETABULAR FRACTURE, LEFT SI SCREW (Left Pelvis)  Patient Location: PACU  Anesthesia Type:General  Level of Consciousness: awake, alert  and oriented  Airway & Oxygen Therapy: Patient Spontanous Breathing and Patient connected to face mask oxygen  Post-op Assessment: Report given to RN, Post -op Vital signs reviewed and stable and Patient moving all extremities X 4  Post vital signs: Reviewed and stable  Last Vitals:  Vitals Value Taken Time  BP    Temp    Pulse    Resp    SpO2      Last Pain:  Vitals:   02/10/19 0648  TempSrc:   PainSc: 10-Worst pain ever         Complications: No apparent anesthesia complications

## 2019-02-11 ENCOUNTER — Encounter (HOSPITAL_COMMUNITY): Payer: Self-pay | Admitting: Orthopedic Surgery

## 2019-02-11 DIAGNOSIS — S32810A Multiple fractures of pelvis with stable disruption of pelvic ring, initial encounter for closed fracture: Secondary | ICD-10-CM

## 2019-02-11 DIAGNOSIS — S32402A Unspecified fracture of left acetabulum, initial encounter for closed fracture: Secondary | ICD-10-CM

## 2019-02-11 HISTORY — DX: Unspecified fracture of left acetabulum, initial encounter for closed fracture: S32.402A

## 2019-02-11 HISTORY — DX: Multiple fractures of pelvis with stable disruption of pelvic ring, initial encounter for closed fracture: S32.810A

## 2019-02-11 LAB — CBC WITH DIFFERENTIAL/PLATELET
Abs Immature Granulocytes: 0.04 10*3/uL (ref 0.00–0.07)
Basophils Absolute: 0 10*3/uL (ref 0.0–0.1)
Basophils Relative: 0 %
Eosinophils Absolute: 0 10*3/uL (ref 0.0–0.5)
Eosinophils Relative: 0 %
HCT: 37.3 % — ABNORMAL LOW (ref 39.0–52.0)
Hemoglobin: 12.7 g/dL — ABNORMAL LOW (ref 13.0–17.0)
Immature Granulocytes: 0 %
Lymphocytes Relative: 12 %
Lymphs Abs: 1.3 10*3/uL (ref 0.7–4.0)
MCH: 30.4 pg (ref 26.0–34.0)
MCHC: 34 g/dL (ref 30.0–36.0)
MCV: 89.2 fL (ref 80.0–100.0)
Monocytes Absolute: 1.1 10*3/uL — ABNORMAL HIGH (ref 0.1–1.0)
Monocytes Relative: 10 %
Neutro Abs: 8.3 10*3/uL — ABNORMAL HIGH (ref 1.7–7.7)
Neutrophils Relative %: 78 %
Platelets: 217 10*3/uL (ref 150–400)
RBC: 4.18 MIL/uL — ABNORMAL LOW (ref 4.22–5.81)
RDW: 12.1 % (ref 11.5–15.5)
WBC: 10.7 10*3/uL — ABNORMAL HIGH (ref 4.0–10.5)
nRBC: 0 % (ref 0.0–0.2)

## 2019-02-11 LAB — C-REACTIVE PROTEIN: CRP: 14.5 mg/dL — ABNORMAL HIGH (ref <1.0)

## 2019-02-11 LAB — D-DIMER, QUANTITATIVE: D-Dimer, Quant: 2.46 ug/mL-FEU — ABNORMAL HIGH (ref 0.00–0.50)

## 2019-02-11 LAB — COMPREHENSIVE METABOLIC PANEL
ALT: 25 U/L (ref 0–44)
AST: 45 U/L — ABNORMAL HIGH (ref 15–41)
Albumin: 3.4 g/dL — ABNORMAL LOW (ref 3.5–5.0)
Alkaline Phosphatase: 30 U/L — ABNORMAL LOW (ref 38–126)
Anion gap: 8 (ref 5–15)
BUN: 9 mg/dL (ref 6–20)
CO2: 29 mmol/L (ref 22–32)
Calcium: 8.7 mg/dL — ABNORMAL LOW (ref 8.9–10.3)
Chloride: 101 mmol/L (ref 98–111)
Creatinine, Ser: 1.04 mg/dL (ref 0.61–1.24)
GFR calc Af Amer: >60 mL/min (ref >60)
GFR calc non Af Amer: >60 mL/min (ref >60)
Glucose, Bld: 130 mg/dL — ABNORMAL HIGH (ref 70–99)
Potassium: 3.7 mmol/L (ref 3.5–5.1)
Sodium: 138 mmol/L (ref 135–145)
Total Bilirubin: 1.3 mg/dL — ABNORMAL HIGH (ref 0.3–1.2)
Total Protein: 6.4 g/dL — ABNORMAL LOW (ref 6.5–8.1)

## 2019-02-11 LAB — PHOSPHORUS: Phosphorus: 3.4 mg/dL (ref 2.5–4.6)

## 2019-02-11 LAB — FERRITIN: Ferritin: 550 ng/mL — ABNORMAL HIGH (ref 24–336)

## 2019-02-11 LAB — CK: Total CK: 2743 U/L — ABNORMAL HIGH (ref 49–397)

## 2019-02-11 LAB — MAGNESIUM: Magnesium: 1.8 mg/dL (ref 1.7–2.4)

## 2019-02-11 MED ORDER — HYDROMORPHONE HCL 1 MG/ML IJ SOLN: 0.5000 mg | Freq: Four times a day (QID) | INTRAMUSCULAR | Status: DC | PRN

## 2019-02-11 MED ORDER — DIPHENHYDRAMINE HCL 25 MG PO CAPS
25.0000 mg | ORAL_CAPSULE | Freq: Four times a day (QID) | ORAL | Status: DC | PRN
  Administered 2019-02-11: 25 mg via ORAL
  Filled 2019-02-11 (×2): qty 1

## 2019-02-11 MED ORDER — METHOCARBAMOL 500 MG PO TABS
1000.0000 mg | ORAL_TABLET | Freq: Three times a day (TID) | ORAL | Status: DC
  Administered 2019-02-11 – 2019-02-12 (×4): 1000 mg via ORAL
  Filled 2019-02-11 (×3): qty 2

## 2019-02-11 MED ORDER — MAGNESIUM SULFATE 2 GM/50ML IV SOLN
2.0000 g | Freq: Once | INTRAVENOUS | Status: AC
  Administered 2019-02-11: 2 g via INTRAVENOUS
  Filled 2019-02-11: qty 50

## 2019-02-11 MED ORDER — SODIUM CHLORIDE 0.9 % IV SOLN
INTRAVENOUS | Status: DC
  Administered 2019-02-11: 15:00:00 via INTRAVENOUS

## 2019-02-11 MED ORDER — ACETAMINOPHEN 500 MG PO TABS
1000.0000 mg | ORAL_TABLET | Freq: Four times a day (QID) | ORAL | Status: DC
  Administered 2019-02-11 – 2019-02-12 (×3): 1000 mg via ORAL
  Filled 2019-02-11 (×4): qty 2

## 2019-02-11 MED ORDER — DIPHENHYDRAMINE HCL 25 MG PO CAPS
25.0000 mg | ORAL_CAPSULE | Freq: Once | ORAL | Status: AC
  Administered 2019-02-11: 25 mg via ORAL
  Filled 2019-02-11: qty 1
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
## 2019-02-11 NOTE — Plan of Care (Signed)
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
## 2019-02-12 DIAGNOSIS — U071 COVID-19: Secondary | ICD-10-CM

## 2019-02-12 LAB — TYPE AND SCREEN
ABO/RH(D): O POS
Antibody Screen: NEGATIVE
Unit division: 0
Unit division: 0
Unit division: 0
Unit division: 0

## 2019-02-12 LAB — CBC WITH DIFFERENTIAL/PLATELET
Abs Immature Granulocytes: 0.02 10*3/uL (ref 0.00–0.07)
Basophils Absolute: 0 10*3/uL (ref 0.0–0.1)
Basophils Relative: 0 %
Eosinophils Absolute: 0.2 10*3/uL (ref 0.0–0.5)
Eosinophils Relative: 2 %
HCT: 34.6 % — ABNORMAL LOW (ref 39.0–52.0)
Hemoglobin: 12 g/dL — ABNORMAL LOW (ref 13.0–17.0)
Immature Granulocytes: 0 %
Lymphocytes Relative: 27 %
Lymphs Abs: 2 10*3/uL (ref 0.7–4.0)
MCH: 30.6 pg (ref 26.0–34.0)
MCHC: 34.7 g/dL (ref 30.0–36.0)
MCV: 88.3 fL (ref 80.0–100.0)
Monocytes Absolute: 0.8 10*3/uL (ref 0.1–1.0)
Monocytes Relative: 10 %
Neutro Abs: 4.6 10*3/uL (ref 1.7–7.7)
Neutrophils Relative %: 61 %
Platelets: 203 10*3/uL (ref 150–400)
RBC: 3.92 MIL/uL — ABNORMAL LOW (ref 4.22–5.81)
RDW: 11.9 % (ref 11.5–15.5)
WBC: 7.6 10*3/uL (ref 4.0–10.5)
nRBC: 0 % (ref 0.0–0.2)

## 2019-02-12 LAB — COMPREHENSIVE METABOLIC PANEL
ALT: 24 U/L (ref 0–44)
AST: 52 U/L — ABNORMAL HIGH (ref 15–41)
Albumin: 3.1 g/dL — ABNORMAL LOW (ref 3.5–5.0)
Alkaline Phosphatase: 31 U/L — ABNORMAL LOW (ref 38–126)
Anion gap: 8 (ref 5–15)
BUN: 10 mg/dL (ref 6–20)
CO2: 29 mmol/L (ref 22–32)
Calcium: 8.3 mg/dL — ABNORMAL LOW (ref 8.9–10.3)
Chloride: 99 mmol/L (ref 98–111)
Creatinine, Ser: 0.97 mg/dL (ref 0.61–1.24)
GFR calc Af Amer: >60 mL/min (ref >60)
GFR calc non Af Amer: >60 mL/min (ref >60)
Glucose, Bld: 100 mg/dL — ABNORMAL HIGH (ref 70–99)
Potassium: 3.5 mmol/L (ref 3.5–5.1)
Sodium: 136 mmol/L (ref 135–145)
Total Bilirubin: 1.1 mg/dL (ref 0.3–1.2)
Total Protein: 6 g/dL — ABNORMAL LOW (ref 6.5–8.1)

## 2019-02-12 LAB — BPAM RBC
Blood Product Expiration Date: 202012282359
Blood Product Expiration Date: 202012282359
Blood Product Expiration Date: 202012282359
Blood Product Expiration Date: 202012282359
ISSUE DATE / TIME: 202011291107
ISSUE DATE / TIME: 202011291107
Unit Type and Rh: 5100
Unit Type and Rh: 5100
Unit Type and Rh: 5100
Unit Type and Rh: 5100

## 2019-02-12 LAB — C-REACTIVE PROTEIN: CRP: 13.1 mg/dL — ABNORMAL HIGH (ref <1.0)

## 2019-02-12 LAB — CK: Total CK: 3363 U/L — ABNORMAL HIGH (ref 49–397)

## 2019-02-12 LAB — MAGNESIUM: Magnesium: 1.8 mg/dL (ref 1.7–2.4)

## 2019-02-12 LAB — D-DIMER, QUANTITATIVE: D-Dimer, Quant: 3.16 ug/mL-FEU — ABNORMAL HIGH (ref 0.00–0.50)

## 2019-02-12 LAB — FERRITIN: Ferritin: 383 ng/mL — ABNORMAL HIGH (ref 24–336)

## 2019-02-12 LAB — PHOSPHORUS: Phosphorus: 3.6 mg/dL (ref 2.5–4.6)

## 2019-02-12 MED ORDER — SODIUM CHLORIDE 0.9 % IV BOLUS: 1000.0000 mL | Freq: Once | INTRAVENOUS | Status: DC

## 2019-02-12 MED ORDER — RIVAROXABAN 10 MG PO TABS: 10.0000 mg | ORAL_TABLET | Freq: Every day | ORAL | 0 refills | Status: AC

## 2019-02-12 MED ORDER — RIVAROXABAN 10 MG PO TABS: 10.0000 mg | ORAL_TABLET | Freq: Every day | ORAL | Status: DC

## 2019-02-12 MED ORDER — DOCUSATE SODIUM 100 MG PO CAPS: 100.0000 mg | ORAL_CAPSULE | Freq: Two times a day (BID) | ORAL | 0 refills | Status: AC

## 2019-02-12 MED ORDER — ACETAMINOPHEN 500 MG PO TABS: 500.0000 mg | ORAL_TABLET | Freq: Two times a day (BID) | ORAL | 0 refills | Status: AC

## 2019-02-12 MED ORDER — POLYETHYLENE GLYCOL 3350 17 G PO PACK
17.0000 g | PACK | Freq: Every day | ORAL | Status: DC
  Administered 2019-02-12: 17 g via ORAL
  Filled 2019-02-12: qty 1

## 2019-02-12 MED ORDER — VITAMIN D 125 MCG (5000 UT) PO CAPS: 1.0000 | ORAL_CAPSULE | Freq: Every day | ORAL | 2 refills | Status: AC

## 2019-02-12 MED ORDER — BETHANECHOL CHLORIDE 10 MG PO TABS
10.0000 mg | ORAL_TABLET | Freq: Three times a day (TID) | ORAL | Status: DC
  Administered 2019-02-12 (×2): 10 mg via ORAL
  Filled 2019-02-12 (×3): qty 1

## 2019-02-12 MED ORDER — DOCUSATE SODIUM 100 MG PO CAPS
100.0000 mg | ORAL_CAPSULE | Freq: Two times a day (BID) | ORAL | Status: DC
  Administered 2019-02-12: 100 mg via ORAL
  Filled 2019-02-12: qty 1

## 2019-02-12 MED ORDER — TAMSULOSIN HCL 0.4 MG PO CAPS
0.4000 mg | ORAL_CAPSULE | Freq: Every day | ORAL | Status: DC
  Administered 2019-02-12: 0.4 mg via ORAL
  Filled 2019-02-12: qty 1

## 2019-02-12 MED ORDER — OXYCODONE-ACETAMINOPHEN 7.5-325 MG PO TABS: 1.0000 | ORAL_TABLET | Freq: Four times a day (QID) | ORAL | 0 refills | Status: AC | PRN

## 2019-02-12 MED ORDER — VITAMIN C 1000 MG PO TABS: 1000.0000 mg | ORAL_TABLET | Freq: Every day | ORAL | 1 refills | Status: AC

## 2019-02-12 MED ORDER — METHOCARBAMOL 500 MG PO TABS: 500.0000 mg | ORAL_TABLET | Freq: Four times a day (QID) | ORAL | 0 refills | Status: AC | PRN

## 2019-02-12 MED FILL — METHOCARBAMOL 500 MG TABS: 500 | 8 days supply | Qty: 60 | Fill #0

## 2019-02-12 MED FILL — OXYCODON-ACETAMINOPHEN 7.5-: 7.5-325 | 7 days supply | Qty: 56 | Fill #0

## 2019-02-12 MED FILL — VITAMIN C 500 MG TABLET: 500 | 30 days supply | Qty: 30 | Fill #0

## 2019-02-12 MED FILL — XARELTO 10 MG TABLET: 10 | 30 days supply | Qty: 30 | Fill #0

## 2019-02-12 MED FILL — ACETAMINOPHEN 500MG XT STRE: 500 | 15 days supply | Qty: 30 | Fill #0

## 2019-02-12 MED FILL — DOK 100 MG CAPS: 100 | 5 days supply | Qty: 10 | Fill #0

## 2019-02-12 MED FILL — VITAMIN D3 5,000 UNIT TAB: 125 MCG | 30 days supply | Qty: 30 | Fill #0

## 2019-02-12 NOTE — Progress Notes (Signed)
Pt voided 1052ml in urinal
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
## 2019-02-13 ENCOUNTER — Encounter (HOSPITAL_COMMUNITY): Payer: Self-pay | Admitting: Emergency Medicine
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
## 2020-12-20 ENCOUNTER — Emergency Department (HOSPITAL_BASED_OUTPATIENT_CLINIC_OR_DEPARTMENT_OTHER)
Admission: EM | Admit: 2020-12-20 | Discharge: 2020-12-20 | Disposition: A | Discharge status: Home / Self Care | Payer: 59 | Attending: Student | Admitting: Student

## 2020-12-20 ENCOUNTER — Other Ambulatory Visit: Payer: Self-pay

## 2020-12-20 ENCOUNTER — Encounter (HOSPITAL_BASED_OUTPATIENT_CLINIC_OR_DEPARTMENT_OTHER): Payer: Self-pay

## 2020-12-20 ENCOUNTER — Emergency Department (HOSPITAL_BASED_OUTPATIENT_CLINIC_OR_DEPARTMENT_OTHER): Payer: 59 | Admitting: Radiology

## 2020-12-20 DIAGNOSIS — S93401A Sprain of unspecified ligament of right ankle, initial encounter: Secondary | ICD-10-CM | POA: Diagnosis not present

## 2020-12-20 DIAGNOSIS — Y9367 Activity, basketball: Secondary | ICD-10-CM | POA: Diagnosis not present

## 2020-12-20 DIAGNOSIS — S99911A Unspecified injury of right ankle, initial encounter: Secondary | ICD-10-CM | POA: Diagnosis present

## 2020-12-20 DIAGNOSIS — Z7901 Long term (current) use of anticoagulants: Secondary | ICD-10-CM | POA: Insufficient documentation

## 2020-12-20 DIAGNOSIS — W51XXXA Accidental striking against or bumped into by another person, initial encounter: Secondary | ICD-10-CM | POA: Diagnosis not present

## 2020-12-20 DIAGNOSIS — Z8616 Personal history of COVID-19: Secondary | ICD-10-CM | POA: Insufficient documentation

## 2020-12-20 DIAGNOSIS — F17210 Nicotine dependence, cigarettes, uncomplicated: Secondary | ICD-10-CM | POA: Diagnosis not present

## 2020-12-20 DIAGNOSIS — M25579 Pain in unspecified ankle and joints of unspecified foot: Secondary | ICD-10-CM

## 2020-12-20 MED ORDER — IBUPROFEN 400 MG PO TABS
600.0000 mg | ORAL_TABLET | Freq: Once | ORAL | Status: AC
  Administered 2020-12-20: 600 mg via ORAL
  Filled 2020-12-20: qty 1

## 2020-12-20 NOTE — ED Triage Notes (Signed)
He states he "sprained my (right) ankle playing basketball yesterday." His right ankle is swollen and non-discolored.

## 2020-12-20 NOTE — ED Notes (Signed)
Pt given an ice pack  °
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
## 2021-07-06 ENCOUNTER — Encounter (HOSPITAL_COMMUNITY): Payer: Self-pay

## 2021-07-06 ENCOUNTER — Other Ambulatory Visit: Payer: Self-pay

## 2021-07-06 ENCOUNTER — Emergency Department (HOSPITAL_COMMUNITY)
Admission: EM | Admit: 2021-07-06 | Discharge: 2021-07-06 | Disposition: A | Discharge status: Home / Self Care | Payer: PRIVATE HEALTH INSURANCE | Attending: Emergency Medicine | Admitting: Emergency Medicine

## 2021-07-06 ENCOUNTER — Emergency Department (HOSPITAL_COMMUNITY): Payer: PRIVATE HEALTH INSURANCE

## 2021-07-06 DIAGNOSIS — F17210 Nicotine dependence, cigarettes, uncomplicated: Secondary | ICD-10-CM | POA: Insufficient documentation

## 2021-07-06 DIAGNOSIS — R0602 Shortness of breath: Secondary | ICD-10-CM | POA: Diagnosis present

## 2021-07-06 DIAGNOSIS — M542 Cervicalgia: Secondary | ICD-10-CM | POA: Diagnosis not present

## 2021-07-06 LAB — CBC
HCT: 46.6 % (ref 39.0–52.0)
Hemoglobin: 16.5 g/dL (ref 13.0–17.0)
MCH: 30.4 pg (ref 26.0–34.0)
MCHC: 35.4 g/dL (ref 30.0–36.0)
MCV: 86 fL (ref 80.0–100.0)
Platelets: 282 10*3/uL (ref 150–400)
RBC: 5.42 MIL/uL (ref 4.22–5.81)
RDW: 11.9 % (ref 11.5–15.5)
WBC: 5.5 10*3/uL (ref 4.0–10.5)
nRBC: 0 % (ref 0.0–0.2)

## 2021-07-06 LAB — BASIC METABOLIC PANEL
Anion gap: 10 (ref 5–15)
BUN: 10 mg/dL (ref 6–20)
CO2: 24 mmol/L (ref 22–32)
Calcium: 9.4 mg/dL (ref 8.9–10.3)
Chloride: 102 mmol/L (ref 98–111)
Creatinine, Ser: 0.93 mg/dL (ref 0.61–1.24)
GFR, Estimated: >60 mL/min (ref >60)
Glucose, Bld: 133 mg/dL — ABNORMAL HIGH (ref 70–99)
Potassium: 2.9 mmol/L — ABNORMAL LOW (ref 3.5–5.1)
Sodium: 136 mmol/L (ref 135–145)

## 2021-07-06 LAB — TROPONIN I (HIGH SENSITIVITY)
Troponin I (High Sensitivity): <2 ng/L (ref <18)
Troponin I (High Sensitivity): <2 ng/L (ref <18)

## 2021-07-06 NOTE — ED Provider Triage Note (Signed)
Emergency Medicine Provider Triage Evaluation Note ? ?Randy Perkins , a 31 y.o. male  was evaluated in triage.  Pt complains of shortness of breath.  Patient states that he woke up from sleep and feels like he cannot take a deep breath.  He also feels pain goes up his back and into his neck.  He describes it as a muscle spasm.  He denies any cough.  No history of asthma.  No history of PE, no recent travel, no leg swelling.Endorses mild chest pain. No syncope ? ?Review of Systems  ?Positive: As above  ?Negative: As above ? ?Physical Exam  ?There were no vitals taken for this visit. ?Gen:   Awake, no distress   ?Resp:  Normal effort CTAB  ?MSK:   Moves extremities without difficulty  ?Other:   ? ?Medical Decision Making  ?Medically screening exam initiated at 2:55 AM.  Appropriate orders placed.  Randy Perkins was informed that the remainder of the evaluation will be completed by another provider, this initial triage assessment does not replace that evaluation, and the importance of remaining in the ED until their evaluation is complete. ? ? ?  ?Mare Ferrari, PA-C ?07/06/21 0305 ? ?

## 2021-07-06 NOTE — ED Provider Notes (Signed)
?[facility] DEPT ?[facility] Emergency Department ?Provider Note ?MRN:  [id number]  ?Arrival date & time: 07/06/21    ? ?Chief Complaint   ?Shortness of Breath ?  ?History of Present Illness   ?Randy Perkins is a 31 y.o. year-old male with no pertinent past medical history presenting to the ED with chief complaint of shortness of breath. ? ?Sudden onset shortness of breath with some chest, neck, shoulder discomfort on the left side.  Felt like he could not breathe.  Symptoms lasting several minutes and then resolving.  Feels fine now. ? ?Review of Systems  ?A thorough review of systems was obtained and all systems are negative except as noted in the HPI and PMH.  ? ?Patient's Health History   ? ?Past Medical History:  ?Diagnosis Date  ? Acetabulum fracture, left ([place]) 02/11/2019  ? Pelvic ring fracture ([place]), left 02/11/2019  ?  ?Past Surgical History:  ?Procedure Laterality Date  ? ORIF ACETABULAR FRACTURE Left 02/10/2019  ? Procedure: OPEN REDUCTION INTERNAL FIXATION (ORIF) ACETABULAR FRACTURE, LEFT SI SCREW;  Surgeon: Altamese Green [place], MD;  Location: [facility];  Service: Orthopedics;  Laterality: Left;  ?  ?History reviewed. No pertinent family history.  ?Social History  ? ?Socioeconomic History  ? Marital status: Single  ?  Spouse name: Not on file  ? Number of children: Not on file  ? Years of education: Not on file  ? Highest education level: Not on file  ?Occupational History  ? Not on file  ?Tobacco Use  ? Smoking status: Every Day  ?  Packs/day: 0.50  ?  Types: Cigarettes  ? Smokeless tobacco: Never  ?Vaping Use  ? Vaping Use: Never used  ?Substance and Sexual Activity  ? Alcohol use: Yes  ?  Comment: social   ? Drug use: Never  ? Sexual activity: Not on file  ?Other Topics Concern  ? Not on file  ?Social History Narrative  ? ** Merged History Encounter **  ?    ? ?Social Determinants of Health  ? ?Financial Resource Strain: Not on file  ?Food Insecurity: Not on file  ?Transportation Needs: Not on  file  ?Physical Activity: Not on file  ?Stress: Not on file  ?Social Connections: Not on file  ?Intimate Partner Violence: Not on file  ?  ? ?Physical Exam  ? ?Vitals:  ? 07/06/21 0547 07/06/21 0604  ?BP: 107/77   ?Pulse: 68   ?Resp: 17   ?Temp: 97.8 ?F (36.6 ?C)   ?SpO2: 100% 100%  ?  ?CONSTITUTIONAL: Well-appearing, NAD ?NEURO/PSYCH:  Alert and oriented x 3, no focal deficits ?EYES:  eyes equal and reactive ?ENT/NECK:  no LAD, no JVD ?CARDIO: Regular rate, well-perfused, normal S1 and S2 ?PULM:  CTAB no wheezing or rhonchi ?GI/GU:  non-distended, non-tender ?MSK/SPINE:  No gross deformities, no edema ?SKIN:  no rash, atraumatic ? ? ?*Additional and/or pertinent findings included in MDM below ? ?Diagnostic and Interventional Summary  ? ? EKG Interpretation ? ?Date/Time:  July 06, 2021 at 3: 106: 20 ?Ventricular Rate:   77 ?PR Interval:   162 ?QRS Duration:  94 ?QT Interval: 364   ?QTC Calculation:411   ?R Axis:     ?Text Interpretation: Sinus rhythm, no concerning features or intervals ?Confirmed by Dr. Gerlene Fee at 6:27 AM ?  ? ?  ? ?Labs Reviewed  ?BASIC METABOLIC PANEL - Abnormal; Notable for the following components:  ?    Result Value  ? Potassium 2.9 (*)   ? Glucose,  Bld 133 (*)   ? All other components within normal limits  ?CBC  ?TROPONIN I (HIGH SENSITIVITY)  ?TROPONIN I (HIGH SENSITIVITY)  ?  ?DG Chest 2 View  ?Final Result  ?  ?  ?Medications - No data to display  ? ?Procedures  /  Critical Care ?Procedures ? ?ED Course and Medical Decision Making  ?Initial Impression and Ddx ?Shortness of breath, neck/shoulder/chest pain, favoring MSK.  Had also considered pneumothorax but chest x-ray is normal and patient has bilateral breath sounds and symptoms have resolved.  Normal vital signs, no murmur, triage labs reveal troponin negative x2.  EKG is without concerns.  Nothing to suggest emergent process, PERC negative, highly doubt dissection, appropriate for discharge. ? ?Past medical/surgical history that  increases complexity of ED encounter: None ? ?Interpretation of Diagnostics ?I personally reviewed the EKG and my interpretation is as follows: Sinus rhythm ?   ?See above for further details ? ?Patient Reassessment and Ultimate Disposition/Management ?Discharge home ? ?Patient management required discussion with the following services or consulting groups:  None ? ?Complexity of Problems Addressed ?Acute illness or injury that poses threat of life of bodily function ? ?Additional Data Reviewed and Analyzed ?Further history obtained from: ?Further history from spouse/family member ? ?Additional Factors Impacting ED Encounter Risk ?None ? ?Barth Kirks. Sedonia Small, MD ?[facility] Emergency Medicine ?[place] ?[email or web address] ? ?Final Clinical Impressions(s) / ED Diagnoses  ? ?  ICD-10-CM   ?1. SOB (shortness of breath)  R06.02   ?  ?2. Neck pain  M54.2   ?  ?  ?ED Discharge Orders   ? ? None  ? ?  ?  ? ?Discharge Instructions Discussed with and Provided to Patient:  ? ? ?Discharge Instructions   ? ?  ?You were evaluated in the Emergency Department and after careful evaluation, we did not find any emergent condition requiring admission or further testing in the hospital. ? ?Your exam/testing today is overall reassuring.  Symptoms may be due to a sprain or spasm of the muscles of the chest wall.  Recommend Tylenol or Motrin as needed for discomfort. ? ?Please return to the Emergency Department if you experience any worsening of your condition.   Thank you for allowing [place] to be a part of your care. ? ? ? ?  ?Maudie Flakes, MD ?07/06/21 [phone] ? ?

## 2021-07-06 NOTE — Discharge Instructions (Signed)
You were evaluated in the Emergency Department and after careful evaluation, we did not find any emergent condition requiring admission or further testing in the hospital. ? ?Your exam/testing today is overall reassuring.  Symptoms may be due to a sprain or spasm of the muscles of the chest wall.  Recommend Tylenol or Motrin as needed for discomfort. ? ?Please return to the Emergency Department if you experience any worsening of your condition.   Thank you for allowing [place] to be a part of your care. ?

## 2021-07-06 NOTE — ED Triage Notes (Signed)
Pt has had SOB for one day with pain in the back of his neck. Pt feel like he cannot take a a deep breath. Pain in neck worse with deep breath. No chest pain. ?

## 2021-07-06 NOTE — ED Notes (Signed)
Patient verbalizes understanding of discharge instructions. Opportunity for questioning and answers were provided. Armband removed by staff, pt discharged from ED. Pt ambulatory to the waiting room.  ?

## 2022-01-10 IMAGING — DX DG ANKLE COMPLETE 3+V*R*
1 series · 3 of 3 positions shown · non-contrast
Comparison: None.

CLINICAL DATA: Right ankle pain post injury.

EXAM:
RIGHT ANKLE - COMPLETE 3+ VIEW

[Series 1: ankle · 0.14mm/px · 3 of 3 slices shown]
[im 1/3]
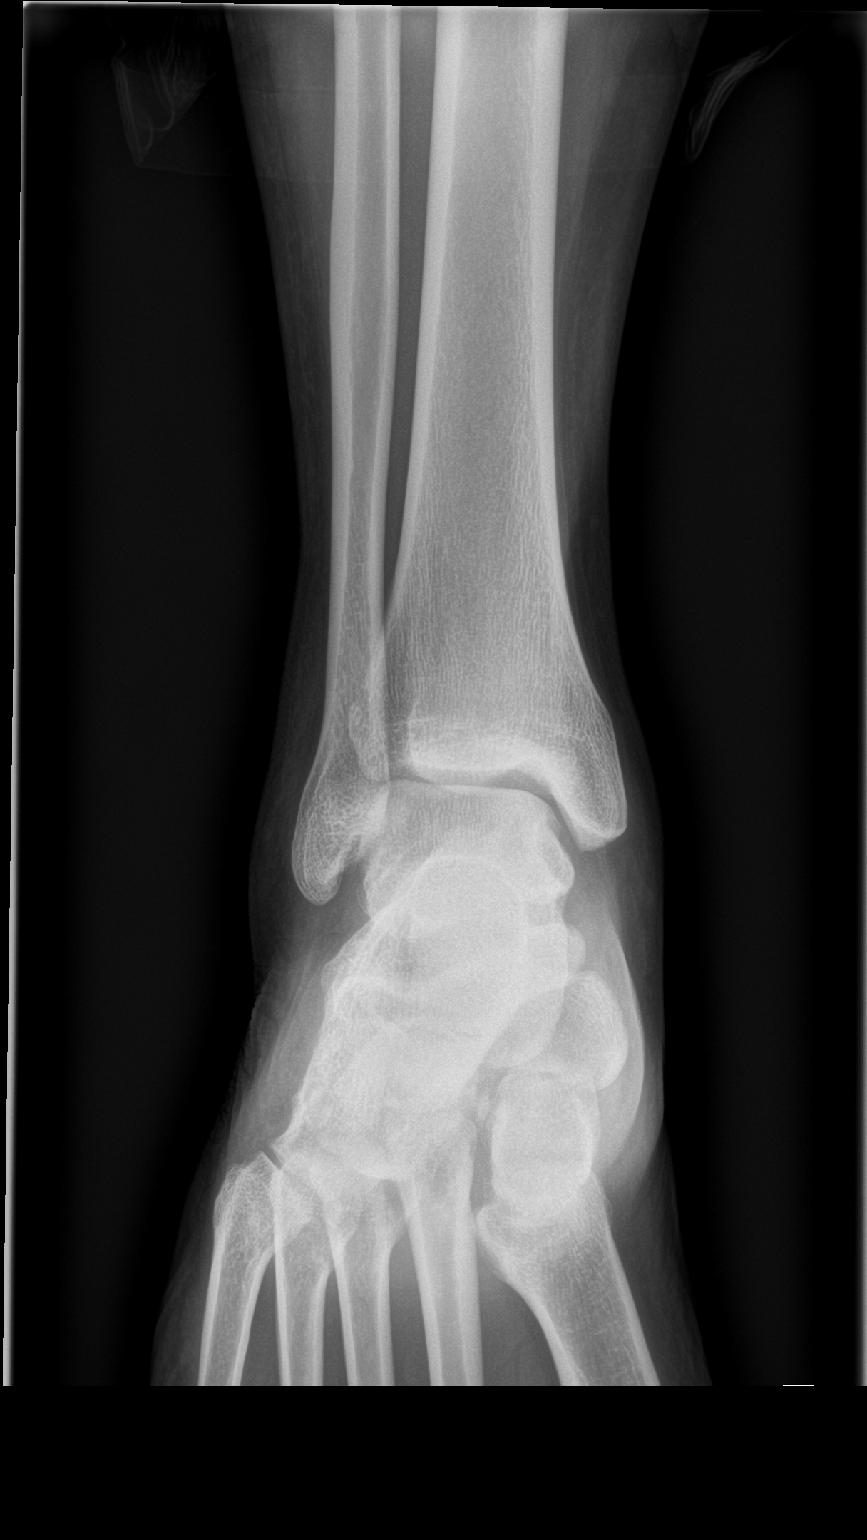
[im 2/3]
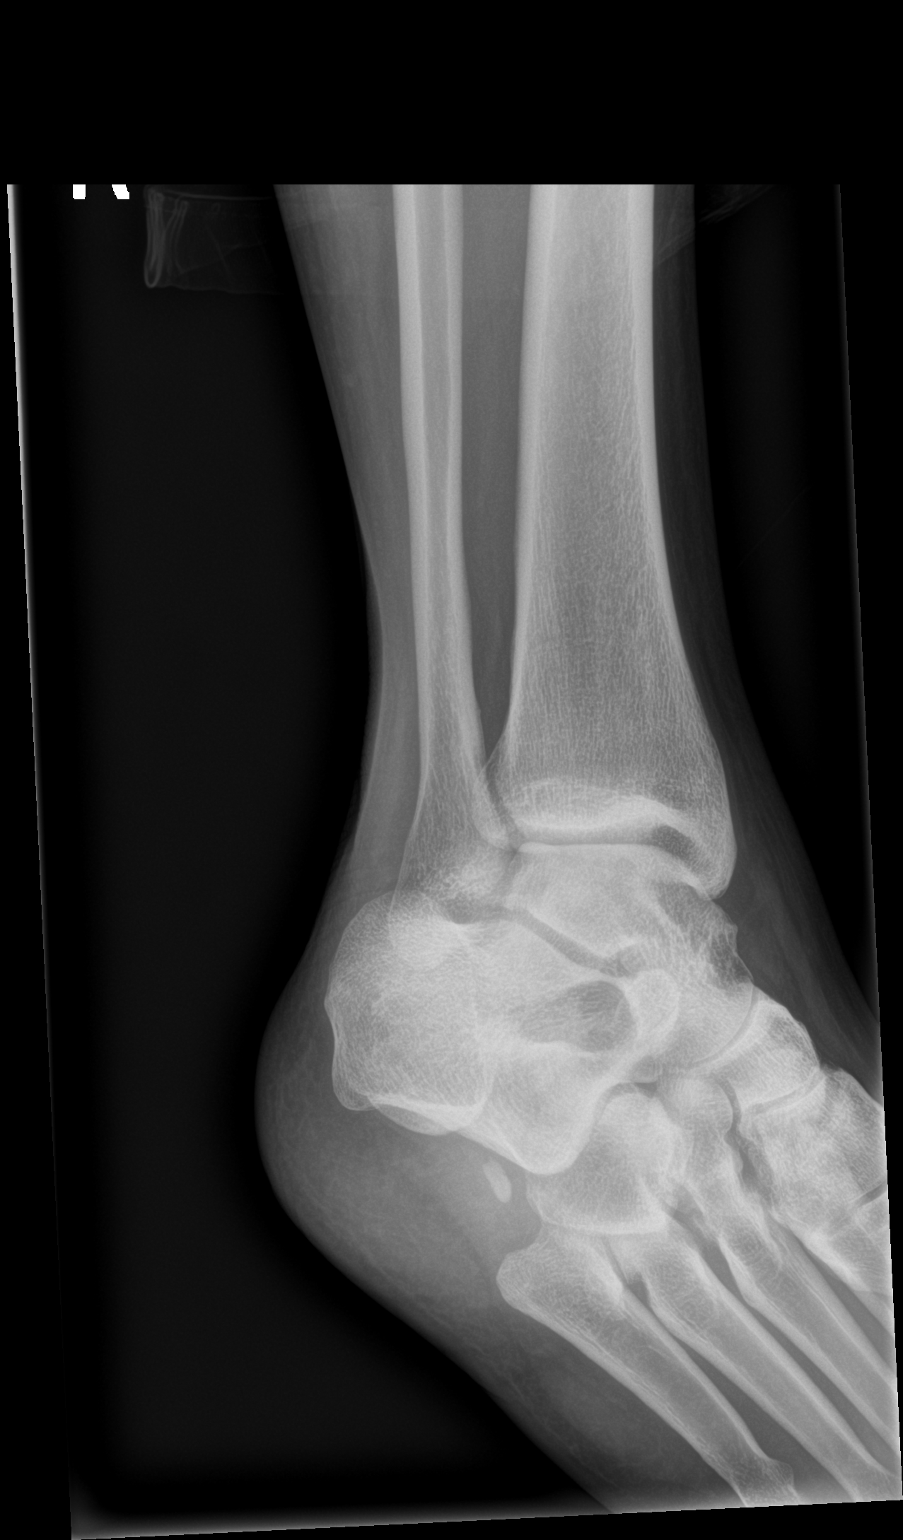
[im 3/3]
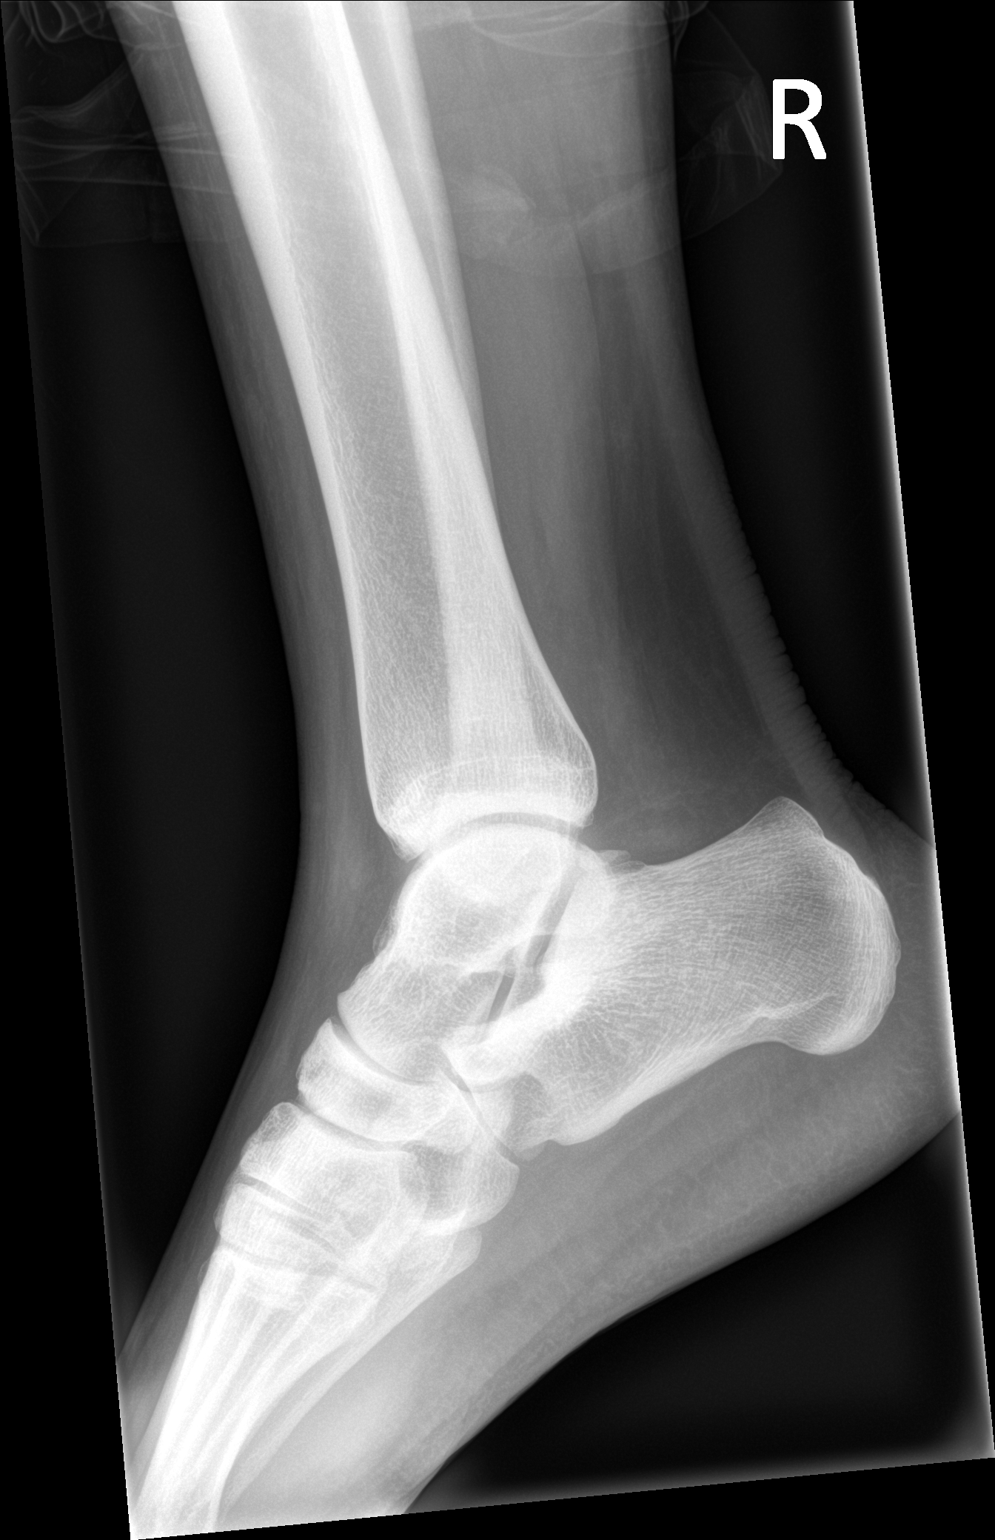

[3 of 3 positions shown; findings below may reference images not displayed]

FINDINGS: There is no evidence of fracture, dislocation, or joint effusion.
There is no evidence of arthropathy or other focal bone abnormality.
Soft tissues are unremarkable.
IMPRESSION: Negative.

## 2024-02-03 ENCOUNTER — Other Ambulatory Visit: Payer: Self-pay

## 2024-02-03 ENCOUNTER — Encounter (HOSPITAL_BASED_OUTPATIENT_CLINIC_OR_DEPARTMENT_OTHER): Payer: Self-pay

## 2024-02-03 ENCOUNTER — Emergency Department (HOSPITAL_BASED_OUTPATIENT_CLINIC_OR_DEPARTMENT_OTHER): Payer: PRIVATE HEALTH INSURANCE | Admitting: Radiology

## 2024-02-03 ENCOUNTER — Emergency Department (HOSPITAL_BASED_OUTPATIENT_CLINIC_OR_DEPARTMENT_OTHER)
Admission: EM | Admit: 2024-02-03 | Discharge: 2024-02-03 | Disposition: A | Discharge status: Home / Self Care | Payer: PRIVATE HEALTH INSURANCE | Attending: Emergency Medicine | Admitting: Emergency Medicine

## 2024-02-03 DIAGNOSIS — R091 Pleurisy: Secondary | ICD-10-CM | POA: Insufficient documentation

## 2024-02-03 DIAGNOSIS — Z7901 Long term (current) use of anticoagulants: Secondary | ICD-10-CM | POA: Insufficient documentation

## 2024-02-03 DIAGNOSIS — J45909 Unspecified asthma, uncomplicated: Secondary | ICD-10-CM | POA: Insufficient documentation

## 2024-02-03 DIAGNOSIS — R059 Cough, unspecified: Secondary | ICD-10-CM | POA: Diagnosis present

## 2024-02-03 DIAGNOSIS — R051 Acute cough: Secondary | ICD-10-CM | POA: Insufficient documentation

## 2024-02-03 LAB — CBC WITH DIFFERENTIAL/PLATELET
Abs Immature Granulocytes: 0.02 K/uL (ref 0.00–0.07)
Basophils Absolute: 0 K/uL (ref 0.0–0.1)
Basophils Relative: 1 %
Eosinophils Absolute: 0.2 K/uL (ref 0.0–0.5)
Eosinophils Relative: 4 %
HCT: 43.2 % (ref 39.0–52.0)
Hemoglobin: 15.3 g/dL (ref 13.0–17.0)
Immature Granulocytes: 0 %
Lymphocytes Relative: 48 %
Lymphs Abs: 2.8 K/uL (ref 0.7–4.0)
MCH: 30 pg (ref 26.0–34.0)
MCHC: 35.4 g/dL (ref 30.0–36.0)
MCV: 84.7 fL (ref 80.0–100.0)
Monocytes Absolute: 0.4 K/uL (ref 0.1–1.0)
Monocytes Relative: 8 %
Neutro Abs: 2.2 K/uL (ref 1.7–7.7)
Neutrophils Relative %: 39 %
Platelets: 268 K/uL (ref 150–400)
RBC: 5.1 MIL/uL (ref 4.22–5.81)
RDW: 12.1 % (ref 11.5–15.5)
WBC: 5.7 K/uL (ref 4.0–10.5)
nRBC: 0 % (ref 0.0–0.2)

## 2024-02-03 LAB — BASIC METABOLIC PANEL WITH GFR
Anion gap: 9 (ref 5–15)
BUN: 11 mg/dL (ref 6–20)
CO2: 28 mmol/L (ref 22–32)
Calcium: 9.5 mg/dL (ref 8.9–10.3)
Chloride: 102 mmol/L (ref 98–111)
Creatinine, Ser: 0.97 mg/dL (ref 0.61–1.24)
GFR, Estimated: >60 mL/min (ref >60)
Glucose, Bld: 107 mg/dL — ABNORMAL HIGH (ref 70–99)
Potassium: 3.7 mmol/L (ref 3.5–5.1)
Sodium: 139 mmol/L (ref 135–145)

## 2024-02-03 LAB — RESP PANEL BY RT-PCR (RSV, FLU A&B, COVID)  RVPGX2
Influenza A by PCR: NEGATIVE
Influenza B by PCR: NEGATIVE
Resp Syncytial Virus by PCR: NEGATIVE
SARS Coronavirus 2 by RT PCR: NEGATIVE

## 2024-02-03 LAB — TROPONIN T, HIGH SENSITIVITY: Troponin T High Sensitivity: <15 ng/L (ref 0–19)

## 2024-02-03 LAB — D-DIMER, QUANTITATIVE: D-Dimer, Quant: <0.27 ug{FEU}/mL (ref 0.00–0.50)

## 2024-02-03 MED ORDER — KETOROLAC TROMETHAMINE 15 MG/ML IJ SOLN
15.0000 mg | Freq: Once | INTRAMUSCULAR | Status: AC
  Administered 2024-02-03: 15 mg via INTRAVENOUS
  Filled 2024-02-03: qty 1

## 2024-02-03 NOTE — ED Provider Notes (Signed)
 [place] EMERGENCY DEPARTMENT AT [facility] Provider Note   CSN: [id number] Arrival date & time: 02/03/24  2001     Patient presents with: Cough and Chest Pain   Randy Perkins is a 33 y.o. male.   Patient is a 33 year old male who presents with cough and chest pain.  He said he has had a cough about a week.  He is coughing up some phlegm.  He denies any runny nose or nasal congestion.  He said over the last 2 days he has had some pain when he coughs.  It is in his left chest.  The pain is really only when he coughs.  It also hurts when he takes a deep breath.  He does report some shortness of breath.  No leg pain or swelling.  No fevers.  No nasal congestion.  No vomiting or diarrhea.  He has a history of childhood asthma but says he does not use inhalers anymore.  He did have a family member with URI symptoms.       Prior to Admission medications   Medication Sig Start Date End Date Taking? Authorizing Provider  acetaminophen  (TYLENOL ) 500 MG tablet Take 1 tablet (500 mg total) by mouth every 12 (twelve) hours. 02/12/19   Deward Eck, PA-C  amoxicillin  (AMOXIL ) 500 MG capsule Take 2 capsules (1,000 mg total) by mouth 2 (two) times daily. 02/21/18   Odell Balls, PA-C  Ascorbic Acid  (VITAMIN C ) 1000 MG tablet Take 1 tablet (1,000 mg total) by mouth daily. 02/13/19   Deward Eck, PA-C  Cholecalciferol  (VITAMIN D ) 125 MCG (5000 UT) CAPS Take 1 capsule by mouth daily. 02/12/19   Deward Eck, PA-C  docusate sodium  (COLACE) 100 MG capsule Take 1 capsule (100 mg total) by mouth 2 (two) times daily. 02/12/19   Deward Eck, PA-C  ibuprofen  (ADVIL ,MOTRIN ) 600 MG tablet Take 1 tablet (600 mg total) by mouth every 6 (six) hours as needed. 09/06/17   Jason Fish B, PA-C  methocarbamol  (ROBAXIN ) 500 MG tablet Take 1 tablet (500 mg total) by mouth 2 (two) times daily. 09/06/17   Murray, Alyssa B, PA-C  methocarbamol  (ROBAXIN ) 500 MG tablet Take 1-2 tablets (500-1,000 mg total) by mouth  every 6 (six) hours as needed for muscle spasms. 02/12/19   Deward Eck, PA-C  rivaroxaban  (XARELTO ) 10 MG TABS tablet Take 1 tablet (10 mg total) by mouth daily. 02/13/19   Maczis, Michael M, PA-C    Allergies: Patient has no known allergies.    Review of Systems  Constitutional:  Negative for chills, diaphoresis, fatigue and fever.  HENT:  Negative for congestion, rhinorrhea and sneezing.   Eyes: Negative.   Respiratory:  Positive for cough and shortness of breath.   Cardiovascular:  Positive for chest pain. Negative for leg swelling.  Gastrointestinal:  Negative for abdominal pain, diarrhea, nausea and vomiting.  Genitourinary:  Negative for difficulty urinating, flank pain and frequency.  Musculoskeletal:  Negative for arthralgias and back pain.  Skin:  Negative for rash.  Neurological:  Negative for dizziness, speech difficulty, weakness, numbness and headaches.    Updated Vital Signs BP 126/83   Pulse (!) 59   Temp 97.9 F (36.6 C) (Oral)   Resp 16   SpO2 98%   Physical Exam Constitutional:      Appearance: He is well-developed.  HENT:     Head: Normocephalic and atraumatic.  Eyes:     Pupils: Pupils are equal, round, and reactive to light.  Cardiovascular:  Rate and Rhythm: Normal rate and regular rhythm.     Heart sounds: Normal heart sounds.  Pulmonary:     Effort: Pulmonary effort is normal. No respiratory distress.     Breath sounds: Normal breath sounds. No wheezing or rales.  Chest:     Chest wall: No tenderness.  Abdominal:     General: Bowel sounds are normal.     Palpations: Abdomen is soft.     Tenderness: There is no abdominal tenderness. There is no guarding or rebound.  Musculoskeletal:        General: Normal range of motion.     Cervical back: Normal range of motion and neck supple.     Comments: No edema or calf tenderness  Lymphadenopathy:     Cervical: No cervical adenopathy.  Skin:    General: Skin is warm and dry.     Findings: No rash.   Neurological:     Mental Status: He is alert and oriented to person, place, and time.     (all labs ordered are listed, but only abnormal results are displayed) Labs Reviewed  BASIC METABOLIC PANEL WITH GFR - Abnormal; Notable for the following components:      Result Value   Glucose, Bld 107 (*)    All other components within normal limits  RESP PANEL BY RT-PCR (RSV, FLU A&B, COVID)  [id number]  CBC WITH DIFFERENTIAL/PLATELET  D-DIMER, QUANTITATIVE  TROPONIN T, HIGH SENSITIVITY    EKG: EKG Interpretation Date/Time:  Saturday February 03 2024 20:15:00 EST Ventricular Rate:  62 PR Interval:  154 QRS Duration:  86 QT Interval:  393 QTC Calculation: 399 R Axis:   91  Text Interpretation: Sinus rhythm Atrial premature complex Borderline right axis deviation ST elev, probable normal early repol pattern since last tracing no significant change Confirmed by Lenor Hollering [phone]) on 02/03/2024 8:26:32 PM  Radiology: DG Chest 2 View Result Date: 02/03/2024 EXAM: 2 VIEW(S) XRAY OF THE CHEST 02/03/2024 09:01:00 PM COMPARISON: 07/06/2021. CLINICAL HISTORY: cough FINDINGS: LUNGS AND PLEURA: No focal pulmonary opacity. No pleural effusion. No pneumothorax. HEART AND MEDIASTINUM: No acute abnormality of the cardiac and mediastinal silhouettes. BONES AND SOFT TISSUES: No acute osseous abnormality. IMPRESSION: 1. No acute cardiopulmonary process. Electronically signed by: Franky Crease MD 02/03/2024 09:07 PM EST RP Workstation: [id number]     Procedures   Medications Ordered in the ED  ketorolac  (TORADOL ) 15 MG/ML injection 15 mg (15 mg Intravenous Given 02/03/24 2047)                                    Medical Decision Making Amount and/or Complexity of Data Reviewed Labs: ordered. Radiology: ordered.  Risk Prescription drug management.   This patient presents to the ED for concern of cough, chest pain, this involves an extensive number of treatment options, and is a complaint that  carries with it a high risk of complications and morbidity.  I considered the following differential and admission for this acute, potentially life threatening condition.  The differential diagnosis includes pneumonia, bronchitis, pleurisy, pneumothorax, ACS, PE, musculoskeletal pain  MDM:    Patient is a 33 year old who presents with cough and associated left-sided chest pain.  He also reports shortness of breath.  His lungs are clear on exam.  He has no hypoxia.  No tachycardia.  No unilateral leg swelling.  EKG does not show any ischemic changes.  D-dimer is normal and he  does not have other symptoms that would be more suggestive of PE.  Chest x-ray does not show any evidence of pneumonia or pulmonary edema.  Troponin is negative and he does not have other symptoms that sound more concerning for ACS.  He likely has cough related to a viral URI associated with pleurisy.  He was advised on symptomatic care.  He was encouraged to follow-up with his PCP if his symptoms are not improving.  Return precautions were given.  (Labs, imaging, consults)  Labs: I Ordered, and personally interpreted labs.  The pertinent results include: Normal troponin, negative D-dimer, otherwise nonconcerning  Imaging Studies ordered: I ordered imaging studies including chest x-ray I independently visualized and interpreted imaging. I agree with the radiologist interpretation  Additional history obtained from chart.  External records from outside source obtained and reviewed including prior notes  Cardiac Monitoring: The patient was maintained on a cardiac monitor.  If on the cardiac monitor, I personally viewed and interpreted the cardiac monitored which showed an underlying rhythm of: Sinus rhythm  Reevaluation: After the interventions noted above, I reevaluated the patient and found that they have :improved  Social Determinants of Health:    Disposition: Discharged to home  Co morbidities that complicate the  patient evaluation  Past Medical History:  Diagnosis Date   Acetabulum fracture, left ([facility]) 02/11/2019   Pelvic ring fracture ([facility]), left 02/11/2019     Medicines Meds ordered this encounter  Medications   ketorolac  (TORADOL ) 15 MG/ML injection 15 mg    I have reviewed the patients home medicines and have made adjustments as needed  Problem List / ED Course: Problem List Items Addressed This Visit   None Visit Diagnoses       Acute cough    -  Primary     Pleurisy                    Final diagnoses:  Acute cough  Pleurisy    ED Discharge Orders     None          Lenor Hollering, MD 02/03/24 2123

## 2024-02-03 NOTE — Discharge Instructions (Addendum)
 Start taking Mucinex  to help with the congestion.  You can use ibuprofen  and Tylenol  for symptomatic relief.  Follow-up with your primary care doctor within the next few days if your symptoms are not improving.  Return to the emergency room if you have any worsening symptoms.

## 2024-02-03 NOTE — ED Triage Notes (Signed)
 Pt c/o cough x1wk, sharp pains in upper/ L chest onset yesterday. Pt describes pain as burning, cutting, worse w inspiration. Mucinex  has helped w cough. Reports sick contact- my kids' mother.
